# Patient Record
Sex: Female | Born: 1955
Health system: Southern US, Community
[De-identification: ages and names within clinical notes are randomized; demographics above are authoritative.]

## PROBLEM LIST (undated history)

## (undated) DIAGNOSIS — I10 Essential (primary) hypertension: Secondary | ICD-10-CM

## (undated) DIAGNOSIS — N970 Female infertility associated with anovulation: Secondary | ICD-10-CM

## (undated) DIAGNOSIS — E039 Hypothyroidism, unspecified: Secondary | ICD-10-CM

## (undated) DIAGNOSIS — N915 Oligomenorrhea, unspecified: Secondary | ICD-10-CM

## (undated) DIAGNOSIS — E669 Obesity, unspecified: Secondary | ICD-10-CM

## (undated) DIAGNOSIS — E282 Polycystic ovarian syndrome: Secondary | ICD-10-CM

## (undated) DIAGNOSIS — A429 Actinomycosis, unspecified: Secondary | ICD-10-CM

## (undated) DIAGNOSIS — Z8639 Personal history of other endocrine, nutritional and metabolic disease: Secondary | ICD-10-CM

## (undated) DIAGNOSIS — Z30430 Encounter for insertion of intrauterine contraceptive device: Secondary | ICD-10-CM

## (undated) DIAGNOSIS — R946 Abnormal results of thyroid function studies: Secondary | ICD-10-CM

## (undated) DIAGNOSIS — E119 Type 2 diabetes mellitus without complications: Secondary | ICD-10-CM

## (undated) DIAGNOSIS — Z8742 Personal history of other diseases of the female genital tract: Secondary | ICD-10-CM

## (undated) HISTORY — DX: Abnormal results of thyroid function studies: R94.6

## (undated) HISTORY — DX: Type 2 diabetes mellitus without complications: E11.9

## (undated) HISTORY — DX: Personal history of other diseases of the female genital tract: Z87.42

## (undated) HISTORY — DX: Essential (primary) hypertension: I10

## (undated) HISTORY — DX: Polycystic ovarian syndrome: E28.2

## (undated) HISTORY — DX: Obesity, unspecified: E66.9

## (undated) HISTORY — PX: THYROID EXPLORATION: SHX5280

## (undated) HISTORY — DX: Actinomycosis, unspecified: A42.9

## (undated) HISTORY — DX: Encounter for insertion of intrauterine contraceptive device: Z30.430

## (undated) HISTORY — DX: Oligomenorrhea, unspecified: N91.5

## (undated) HISTORY — DX: Female infertility associated with anovulation: N97.0

## (undated) HISTORY — DX: Personal history of other endocrine, nutritional and metabolic disease: Z86.39

---
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
## 1990-06-02 DIAGNOSIS — Z30430 Encounter for insertion of intrauterine contraceptive device: Secondary | ICD-10-CM

## 1990-06-02 HISTORY — DX: Encounter for insertion of intrauterine contraceptive device: Z30.430

## 1998-02-04 ENCOUNTER — Ambulatory Visit (HOSPITAL_COMMUNITY): Admission: RE | Admit: 1998-02-04 | Discharge: 1998-02-04 | Payer: Self-pay | Admitting: Obstetrics and Gynecology

## 1998-03-03 ENCOUNTER — Encounter: Admission: RE | Admit: 1998-03-03 | Discharge: 1998-06-01 | Payer: Self-pay | Admitting: Family Medicine

## 1999-01-05 ENCOUNTER — Other Ambulatory Visit: Admission: RE | Admit: 1999-01-05 | Discharge: 1999-01-05 | Payer: Self-pay | Admitting: Obstetrics and Gynecology

## 2000-03-27 ENCOUNTER — Other Ambulatory Visit: Admission: RE | Admit: 2000-03-27 | Discharge: 2000-03-27 | Payer: Self-pay | Admitting: Obstetrics and Gynecology

## 2001-04-21 ENCOUNTER — Encounter: Admission: RE | Admit: 2001-04-21 | Discharge: 2001-07-20 | Payer: Self-pay | Admitting: Family Medicine

## 2001-04-23 ENCOUNTER — Other Ambulatory Visit: Admission: RE | Admit: 2001-04-23 | Discharge: 2001-04-23 | Payer: Self-pay | Admitting: Obstetrics and Gynecology

## 2003-06-09 ENCOUNTER — Other Ambulatory Visit: Admission: RE | Admit: 2003-06-09 | Discharge: 2003-06-09 | Payer: Self-pay | Admitting: Obstetrics and Gynecology

## 2003-07-08 ENCOUNTER — Ambulatory Visit (HOSPITAL_COMMUNITY): Admission: RE | Admit: 2003-07-08 | Discharge: 2003-07-08 | Payer: Self-pay | Admitting: Cardiology

## 2004-04-05 ENCOUNTER — Other Ambulatory Visit: Admission: RE | Admit: 2004-04-05 | Discharge: 2004-04-05 | Payer: Self-pay | Admitting: Obstetrics and Gynecology

## 2005-04-09 ENCOUNTER — Other Ambulatory Visit: Admission: RE | Admit: 2005-04-09 | Discharge: 2005-04-09 | Payer: Self-pay | Admitting: Obstetrics and Gynecology

## 2006-04-23 ENCOUNTER — Other Ambulatory Visit: Admission: RE | Admit: 2006-04-23 | Discharge: 2006-04-23 | Payer: Self-pay | Admitting: Obstetrics and Gynecology

## 2011-03-27 ENCOUNTER — Encounter: Payer: Self-pay | Admitting: Gastroenterology

## 2012-07-11 DIAGNOSIS — Z8639 Personal history of other endocrine, nutritional and metabolic disease: Secondary | ICD-10-CM | POA: Insufficient documentation

## 2012-07-11 DIAGNOSIS — E282 Polycystic ovarian syndrome: Secondary | ICD-10-CM | POA: Insufficient documentation

## 2012-07-11 DIAGNOSIS — E669 Obesity, unspecified: Secondary | ICD-10-CM | POA: Insufficient documentation

## 2012-07-11 DIAGNOSIS — N970 Female infertility associated with anovulation: Secondary | ICD-10-CM | POA: Insufficient documentation

## 2012-07-11 DIAGNOSIS — E119 Type 2 diabetes mellitus without complications: Secondary | ICD-10-CM | POA: Insufficient documentation

## 2012-07-11 DIAGNOSIS — Z8742 Personal history of other diseases of the female genital tract: Secondary | ICD-10-CM | POA: Insufficient documentation

## 2012-07-11 DIAGNOSIS — Z30432 Encounter for removal of intrauterine contraceptive device: Secondary | ICD-10-CM | POA: Insufficient documentation

## 2012-07-11 DIAGNOSIS — R946 Abnormal results of thyroid function studies: Secondary | ICD-10-CM | POA: Insufficient documentation

## 2012-07-11 DIAGNOSIS — N915 Oligomenorrhea, unspecified: Secondary | ICD-10-CM | POA: Insufficient documentation

## 2012-07-11 DIAGNOSIS — I10 Essential (primary) hypertension: Secondary | ICD-10-CM | POA: Insufficient documentation

## 2012-07-11 DIAGNOSIS — Z30430 Encounter for insertion of intrauterine contraceptive device: Secondary | ICD-10-CM | POA: Insufficient documentation

## 2012-07-11 DIAGNOSIS — A429 Actinomycosis, unspecified: Secondary | ICD-10-CM | POA: Insufficient documentation

## 2012-07-16 ENCOUNTER — Ambulatory Visit: Payer: Self-pay | Admitting: Obstetrics and Gynecology

## 2012-07-29 ENCOUNTER — Ambulatory Visit (INDEPENDENT_AMBULATORY_CARE_PROVIDER_SITE_OTHER): Payer: BC Managed Care – PPO | Admitting: Obstetrics and Gynecology

## 2012-07-29 ENCOUNTER — Encounter: Payer: Self-pay | Admitting: Obstetrics and Gynecology

## 2012-07-29 VITALS — BP 140/84 | HR 82 | Ht 64.5 in | Wt 298.0 lb

## 2012-07-29 DIAGNOSIS — Z124 Encounter for screening for malignant neoplasm of cervix: Secondary | ICD-10-CM

## 2012-07-29 DIAGNOSIS — N951 Menopausal and female climacteric states: Secondary | ICD-10-CM

## 2012-07-29 DIAGNOSIS — Z01419 Encounter for gynecological examination (general) (routine) without abnormal findings: Secondary | ICD-10-CM

## 2012-07-29 DIAGNOSIS — R6882 Decreased libido: Secondary | ICD-10-CM

## 2012-07-29 NOTE — Progress Notes (Signed)
Regular Periods: no Mammogram: yes  Monthly Breast Ex.: yes Exercise: yes  Tetanus < 10 years: no Seatbelts: yes  NI. Bladder Functn.: yes Abuse at home: no  Daily BM's: yes Stressful Work: yes  Healthy Diet: yes Sigmoid-Colonoscopy: NEVER  Calcium: no Medical problems this year: NO PROBLEMS   LAST PAP:2012  Contraception: IUD MIRENA  Mammogram:  12/12  PCP: DR. Chalmers Cater, DR.SMITH  PMH: NO CHANGE  Marine: NO CHANGE  Last Bone Scan: NO  PT IS MARRIED. +

## 2012-07-29 NOTE — Progress Notes (Signed)
Subjective:    Julie [name] is a 56 y.o. female G3P2 who presents for annual exam. The patient has no complaints except has had decreased libido for the past 2 years.  Admits to fatigue (physical & mental) and over the past year has not had her usual molimina.  Reviewed  causes of decreased libido and the developmental changes that come with menopause.    Review of Systems Gastrointestinal:No change in bowel habits, no abdominal pain, no rectal bleeding Genitourinary:negative for abnormal vaginal bleeding,  dysuria, frequency, hematuria, nocturia and urinary incontinence    Objective:     Ht 5' 4.5" (1.638 m)  Wt 298 lb (135.172 kg)  BMI 50.36 kg/m2 Weight:  Wt Readings from Last 1 Encounters:  07/29/12 298 lb (135.172 kg)   Body mass index is 50.36 kg/(m^2). General Appearance:  Well nourished in no acute distress HEENT: Grossly normal Neck / Thyroid: Supple, no masses, nodes or enlargement Lungs: Clear to auscultation bilaterally Back: No CVA tenderness Breast Exam: No masses or nodes.No dimpling, nipple retraction or discharge. Cardiovascular: Regular rate and rhythm.  Gastrointestinal: Soft, non-tender, no masses or organomegaly Pelvic Exam: EGBUS-normal, vagina-pink mucosa without lesions, cervix-no tenderness or lesions, uterus-appears normal size, shape and consistency, adnexae-no masses or tenderness Rectovaginal: deferred duet to patient's request Lymphatic Exam: Non-palpable nodes in neck, clavicular, axillary, or inguinal regions Skin: No rash or abnormalities Extremities: no clubbing cyanosis or edema Neurologic: Grossly normal Psychiatric: Alert and oriented x 3    Assessment:   Routine GYN Exam   Plan:   PAP sent  RTO 1 year or prn   Shona Pardo,ELMIRAPA-C

## 2012-07-30 ENCOUNTER — Telehealth: Payer: Self-pay | Admitting: Obstetrics and Gynecology

## 2012-07-30 LAB — PAP IG W/ RFLX HPV ASCU

## 2012-07-30 LAB — TESTOSTERONE, FREE, TOTAL, SHBG
Sex Hormone Binding: 15 nmol/L — ABNORMAL LOW (ref 18–114)
Testosterone: 20.93 ng/dL (ref 10–70)

## 2012-07-30 LAB — FOLLICLE STIMULATING HORMONE: FSH: 27.6 m[IU]/mL

## 2012-07-30 NOTE — Telephone Encounter (Signed)
Call to patient to advise that she is not menopausal and that her other labs were normal.  Patient was not available and per female respondent, she was at work.  Attempted to reach patient at the work number listed however, number was not in service.  Ercel Normoyle, PA-C

## 2012-08-01 LAB — HUMAN PAPILLOMAVIRUS, HIGH RISK: HPV DNA High Risk: NOT DETECTED

## 2012-08-05 ENCOUNTER — Encounter: Payer: Self-pay | Admitting: Obstetrics and Gynecology

## 2012-08-07 ENCOUNTER — Telehealth: Payer: Self-pay | Admitting: Obstetrics and Gynecology

## 2012-08-07 NOTE — Telephone Encounter (Signed)
vph pt

## 2012-08-07 NOTE — Telephone Encounter (Signed)
Lm on vm to cb per telephone call rgdg letter received about pap smear.

## 2013-09-15 ENCOUNTER — Ambulatory Visit: Payer: Self-pay

## 2013-09-22 ENCOUNTER — Ambulatory Visit: Payer: Self-pay

## 2013-09-29 ENCOUNTER — Ambulatory Visit: Payer: Self-pay

## 2013-10-07 ENCOUNTER — Telehealth: Payer: Self-pay | Admitting: *Deleted

## 2013-10-07 NOTE — Telephone Encounter (Signed)
I informed pt her fungal culture was positive for fungus.  I explained Dr. Shaune Pollack orders of Onmel #90 one tablet everyday, hepatic function labs to be performed prior to beginning the medication.  Pt states could have the labs drawn in 10/2013 at her endocrinologist, but would like to look for more information on Onmel, and would like to know if laser would help the one toenail affected.  Dr. Valentina Lucks states the Laser would work, I informed the pt.  Pt states she will discuss with her husband and call again.

## 2014-08-30 ENCOUNTER — Encounter: Payer: Self-pay | Admitting: Obstetrics and Gynecology

## 2015-07-01 DIAGNOSIS — I701 Atherosclerosis of renal artery: Secondary | ICD-10-CM | POA: Diagnosis present

## 2015-07-03 ENCOUNTER — Other Ambulatory Visit: Payer: Self-pay | Admitting: Cardiology

## 2015-07-03 NOTE — H&P (Signed)
OFFICE VISIT NOTES COPIED TO EPIC FOR DOCUMENTATION  Julie [name] 04-Jul-2015 8:21 AM Location: [facility] Patient #: [id number] DOB: 1956-02-23 Married / Language: English / Race: Black or African American Female  History of Present Illness Julie [name] AGNP-C; 07/04/2015 4:02 PM) The patient is a 59 year old female who presents for a Follow-up for Hypertension. Symptoms include shortness of breath, while symptoms do not include headache, dizziness, chest pain or cough while on ACE-I/ARB. Recent blood pressure has been mostly > 140/80. The patient describes this as mild and worsening. Current treatment includes thiazide diuretic, angiotensin receptor blockers, alpha blockers, beta blockers and calcium channel blockers. By report there is good compliance with treatment, good tolerance of treatment and fair symptom control. Pertinent medical history includes diabetes and dyslipidemia. Risk factors include obesity, physical inactivity, diabetes and hyperlipidemia. Pertinent family history includes cardiovascular disease. The patient was previously evaluated by a primary care provider.  Additional reasons for visit:  Follow up test results is described as the following: The patient had a cardiovascular nuclear scan, echocardiography and carotid ultrasound exam. There are no symptoms currently. Current medication use: compliant with dosing regimen, no side effects and considered effective by patient.  Problem List/Past Medical Julie [name]; 07-04-15 11:13 AM) Benign essential hypertension (I10) Labs 04/12/2015: Serum glucose 162, BUN 19, creatinine 1.1, potassium 4.0 Hypercholesteremia (E78.0) Labs 04/12/2015: total cholesterol 281, triglycerides 142, HDL 38, LDL 215, TSH 1.810 Uncontrolled type 2 diabetes mellitus without complication, with long-term current use of insulin (E11.65) Labs 04/12/2015: HbA1c 7.7% Shortness of breath on exertion (R06.02) EKG 05/05/2015: Sinus rhythm  at a rate of 86 bpm, normal axis, left atrial abnormality, 2 mm ST depression in inferior leads and 1 mm ST depression in lateral leads, cannot exclude inferolateral ischemia. Morbid obesity with BMI of 45.0-49.9, adult (E66.01) Family history of premature CAD (Z82.49) Both parents died at age 31 from CAD Abnormal EKG (R94.31) Bilateral carotid bruits (R09.89) Sciatica (M54.30) Hypothyroidism, adult (E03.9)  Allergies (Julie [name]; 07-04-15 11:13 AM) Toprol XL *BETA BLOCKERS* Glimepiride *ANTIDIABETICS* Sulfa Drugs  Family History (Julie [name]; 07/04/2015 11:13 AM) Mother Deceased. at age 70 from MI; no strokes or MIs prior to this event; HTN but no other cardiovascular conditions Father Deceased. at age 84 from MI; no strokes or MIs prior to this event; no other cardiovascular conditions Sister 1 In good health. 5 yrs older; no cardiovascular conditions  Social History Julie [name]; July 04, 2015 11:13 AM) Current tobacco use Never smoker. Non Drinker/No Alcohol Use Marital status Married. Living Situation Lives with spouse. Number of Children 2.  Past Surgical History Julie [name]; 2015-07-04 11:13 AM) None07/04/2015  Medication History Julie [name]; 07/04/2015 11:15 AM) Aspirin Adult Low Dose (81MG Tablet DR, 1 (one) Tablet DR Oral daily, Taken starting 05/05/2015) Active. Spironolactone (50MG Tablet, 1 (one) Tablet Oral daily, Taken starting 05/05/2015) Active. Atenolol (50MG Tablet, 1 Oral daily) Active. Rosuvastatin Calcium (5MG Tablet, 1 Oral daily) Active. Kombiglyze XR (2.5-1000MG Tablet ER 24HR, 1 Oral two times daily) Active. CloNIDine HCl (0.2MG Tablet, 1 Oral daily) Active. Vitamin D (Ergocalciferol) (50000UNIT Capsule, 1 Oral weekly) Active. Tribenzor (40-10-25MG Tablet, 1 Oral at bedtime) Active. Levothyroxine Sodium (137MCG Tablet, 1 Oral daily) Active. NovoLOG Mix 70/30 FlexPen ((70-30) 100UNIT/ML Susp Pen-inj,  inject 20 units in morning and Subcutaneous 15 units in the evening) Active. Zioptan (0.0015% Solution, 1 drop each eye Ophthalmic daily) Active. Magnesium Gluconate (250MG Tablet, Oral) Active. Magnesium (250MG Tablet, 1 Oral daily) Active. Medications Reconciled  Diagnostic Studies History Julie Brook  [name]; 06/10/2015 8:22 AM) Echocardiogram08/11/2014 1. Left ventricle cavity is normal in size. Moderate concentric hypertrophy of the left ventricle. Normal global wall motion. Calculated EF 68%. 2. Mild mitral regurgitation. 3. Mild tricuspid regurgitation. No evidence of pulmonary hypertension. Nuclear stress test07/22/2016 1. The resting electrocardiogram demonstrated normal sinus rhythm, incomplete RBBB and no resting arrhythmias. Stress EKG is non-diagnostic for ischemia as it a pharmacologic stress using Lexiscan. 2. Myocardial perfusion imaging is normal. Overall left ventricular systolic function was normal without regional wall motion abnormalities. The left ventricular ejection fraction was 64%. Carotid Doppler08/12/2014 Moderate stenosis in the right internal carotid artery (50-69%). Mild stenosis in the right common carotid artery (<50%). Moderate stenosis in the left external carotid artery (>50%). Severe stenosis in the left internal carotid artery (>70%). Severe stenosis in the left common carotid artery (>50%) probably in the range > 80-99%. Heavy soft plaque burden throughout the bilateral carotid arteries. Consider further work-up. Follow-up in 6 months if clinically indicated.   Review of Systems [facility] Julie [name], [place]; 06/10/2015 4:12 PM) General Not Present- Anorexia, Fatigue and Fever. Respiratory Present- Decreased Exercise Tolerance and Difficulty Breathing on Exertion. Not Present- Cough. Cardiovascular Present- Edema and Elevated Blood Pressure. Not Present- Chest Pain, Claudications, Orthopnea, Paroxysmal Nocturnal Dyspnea and Shortness of Breath. Gastrointestinal  Not Present- Change in Bowel Habits, Constipation and Nausea. Neurological Not Present- Focal Neurological Symptoms. Endocrine Not Present- Appetite Changes, Cold Intolerance and Heat Intolerance. Hematology Not Present- Anemia, Petechiae and Prolonged Bleeding. Vitals Julie [name]; 06/10/2015 11:19 AM) 06/10/2015 11:18 AM Weight: 291 lb Height: 64.5in Body Surface Area: 2.31 m Body Mass Index: 49.18 kg/m  Pulse: 82 (Regular)  P.OX: 98% (Room air) BP: 168/88 (Sitting, Left Arm, Standard)     Physical Exam (Bridgette Julie [name], AGNP-C; 06/10/2015 4:12 PM) General Mental Status-Alert. General Appearance-Cooperative, Appears stated age, Not in acute distress. Orientation-Oriented X3. Build & Nutrition-Moderately built and Morbidly obese.  Head and Neck Thyroid Gland Characteristics - no palpable nodules, no palpable enlargement.  Chest and Lung Exam Palpation Tender - No chest wall tenderness. Auscultation Breath sounds - Clear.  Cardiovascular Inspection Jugular vein - Right - No Distention. Auscultation Heart Sounds - S1 WNL, S2 WNL and No gallop present. Murmurs & Other Heart Sounds - Murmur - No murmur.  Abdomen Inspection Contour - Obese and Pannus present. Palpation/Percussion Normal exam - Non Tender and No hepatosplenomegaly. Auscultation Normal exam - Bowel sounds normal.  Peripheral Vascular Lower Extremity Inspection - Left - No Pigmentation, No Varicose veins. Right - No Pigmentation, No Varicose veins. Palpation - Edema - Left - Trace edema. Right - Trace edema. Femoral pulse - Left - Normal. Right - Normal. Popliteal pulse - Left - Normal. Right - Normal. Dorsalis pedis pulse - Left - Normal. Right - Normal. Posterior tibial pulse - Left - Normal. Right - Normal. Carotid arteries - Bilateral-Soft bilateral carotid bruit.Pecola Leisure prominent abdominal aortic pulsation, No epigastric bruit.  Neurologic Motor-Grossly intact  without any focal deficits.  Musculoskeletal Global Assessment Left Lower Extremity - normal range of motion without pain. Right Lower Extremity - normal range of motion without pain.  Assessment & Plan (Bridgette Julie [name] AGNP-C; 06/10/2015 4:11 PM) Shortness of breath on exertion (R06.02) Story: Echo- 05/31/2015 1. Left ventricle cavity is normal in size. Moderate concentric hypertrophy of the left ventricle. Normal global wall motion. Calculated EF 68%. 2. Mild mitral regurgitation. 3. Mild tricuspid regurgitation. No evidence of pulmonary hypertension.  Lexiscan myoview stress test 05/20/2015: 1. The resting electrocardiogram demonstrated normal sinus rhythm,  incomplete RBBB and no resting arrhythmias. Stress EKG is non-diagnostic for ischemia as it a pharmacologic stress using Lexiscan. 2. Myocardial perfusion imaging is normal. Overall left ventricular systolic function was normal without regional wall motion abnormalities. The left ventricular ejection fraction was 64%. Impression: EKG 05/05/2015: Sinus rhythm at a rate of 86 bpm, normal axis, left atrial abnormality, 2 mm ST depression in inferior leads and 1 mm ST depression in lateral leads, cannot exclude inferolateral ischemia. Abnormal EKG (R94.31) Accelerated hypertension (I10) Story: Labs 04/12/2015: Serum glucose 162, BUN 19, creatinine 1.1, potassium 4.0 Future Plans 6/72/0947: METABOLIC PANEL, BASIC (09628) - one time 06/27/2015: CBC & PLATELETS (AUTO) (36629) - one time Hypercholesteremia (E78.0) Story: Labs 04/12/2015: total cholesterol 281, triglycerides 142, HDL 38, LDL 215, TSH 1.810 Current Plans Discontinued Crestor 5MG (increase to 95m). Started Crestor 40MG, 1 (one) Tablet daily, #30, 30 days starting 06/10/2015, Ref. x6. Uncontrolled type 2 diabetes mellitus without complication, with long-term current use of insulin (E11.65) Story: Labs 04/12/2015: HbA1c 7.7% Asymptomatic bilateral carotid artery stenosis  ((U76.54 Story: Carotid artery duplex 06/01/2015: Moderate stenosis in the right internal carotid artery (50-69%). Mild stenosis in the right common carotid artery (<50%). Moderate stenosis in the left external carotid artery (>50%). Severe stenosis in the left internal carotid artery (>70%). Severe stenosis in the left common carotid artery (>50%) probably in the range > 80-99%. Heavy soft plaque burden throughout the bilateral carotid arteries. Consider further work-up. Follow-up in 6 months if clinically indicated. Morbid obesity with BMI of 45.0-49.9, adult (E66.01) Family history of premature CAD ((Z77.49 Story: Both parents died at age 889from CAD Current Plans Mechanism of underlying disease process and action of medications discussed with the patient. I discussed primary/secondary prevention and also dietary counseling was done. She presents for follow-up of echocardiogram, stress test, and carotid duplex. Echo reveals moderate concentric LVH, but is otherwise normal. Stress test was normal without evidence of ischemia. Carotid duplex reveals significant bilateral carotid artery stenosis. Due to morbid obesity, difficult to adequately evaluate carotid anatomy. Discussed CT angiography versus percutaneous angiography. Pt will also need renal angiography due to accelerated hypertension on 7 anti-hypertensives. After extensive discussion, patient has opted for percutaneous angiography of both carotids and renal arteries. Will increase Crestor to 480mand allow her to be on this dose for at least 2 weeks prior to performing angiography. Patient understands the risks, benefits, alternatives including medical therapy, CT angiography. Patient understands <1-2% risk of death, embolic complications, bleeding, infection, renal failure, urgent surgical revascularization, but not limited to these and wants to proceed. Blood pressure markedly elevated, however, will not increase medications until arterial  anatomy can be adequately evaluated to avoid cerebral hyperperfusion. Follow up after angiography.  *I have discussed this case with Dr. GaEinar Gipnd he personally examined the patient and participated in formulating the plan.*  Future Plans 06/27/2015: PT (PROTHROMBIN TIME) ([id number]- one time Addendum Note(Bridgette Allison AGNP-C; 06/30/2015 10:10 AM) Labs 06/29/2015: BUN 50, creatinine 2.48, eGFR 24, potassium 4.8, H&H normal with microcytic indices, CBC otherwise PTT 10.5, INR 1.0     Signed by BrNeldon LabellaAGNP-C (06/10/2015 4:13 PM)  I have personally reviewed the patient's record and performed physical exam and agree with the assessment and plan of Ms. BrNeldon LabellaNP-C.  GAAdrian ProwsMD 07/03/2015, 5:27 PM [facility]. PAHaskellager: [phone] Office: [phone] no answer: Cell:  [phone]

## 2015-07-04 ENCOUNTER — Emergency Department (HOSPITAL_COMMUNITY): Admission: EM | Admit: 2015-07-04 | Discharge: 2015-07-04 | Payer: BC Managed Care – PPO

## 2015-07-04 ENCOUNTER — Encounter (HOSPITAL_COMMUNITY): Payer: Self-pay | Admitting: *Deleted

## 2015-07-04 ENCOUNTER — Observation Stay (HOSPITAL_COMMUNITY)
Admission: RE | Admit: 2015-07-04 | Discharge: 2015-07-06 | Disposition: A | Discharge status: Home / Self Care | Payer: BC Managed Care – PPO | Source: Ambulatory Visit | Attending: Cardiology | Admitting: Cardiology

## 2015-07-04 DIAGNOSIS — I701 Atherosclerosis of renal artery: Principal | ICD-10-CM | POA: Insufficient documentation

## 2015-07-04 DIAGNOSIS — E780 Pure hypercholesterolemia: Secondary | ICD-10-CM | POA: Insufficient documentation

## 2015-07-04 DIAGNOSIS — Z8249 Family history of ischemic heart disease and other diseases of the circulatory system: Secondary | ICD-10-CM | POA: Insufficient documentation

## 2015-07-04 DIAGNOSIS — N183 Chronic kidney disease, stage 3 unspecified: Secondary | ICD-10-CM | POA: Diagnosis present

## 2015-07-04 DIAGNOSIS — Z7982 Long term (current) use of aspirin: Secondary | ICD-10-CM | POA: Insufficient documentation

## 2015-07-04 DIAGNOSIS — Z6841 Body Mass Index (BMI) 40.0 and over, adult: Secondary | ICD-10-CM | POA: Insufficient documentation

## 2015-07-04 DIAGNOSIS — I129 Hypertensive chronic kidney disease with stage 1 through stage 4 chronic kidney disease, or unspecified chronic kidney disease: Secondary | ICD-10-CM | POA: Diagnosis not present

## 2015-07-04 DIAGNOSIS — E785 Hyperlipidemia, unspecified: Secondary | ICD-10-CM | POA: Insufficient documentation

## 2015-07-04 DIAGNOSIS — I150 Renovascular hypertension: Secondary | ICD-10-CM | POA: Insufficient documentation

## 2015-07-04 DIAGNOSIS — E1122 Type 2 diabetes mellitus with diabetic chronic kidney disease: Secondary | ICD-10-CM | POA: Diagnosis not present

## 2015-07-04 DIAGNOSIS — E1165 Type 2 diabetes mellitus with hyperglycemia: Secondary | ICD-10-CM | POA: Diagnosis not present

## 2015-07-04 LAB — CBC
HCT: 41.8 % (ref 36.0–46.0)
Hemoglobin: 13.3 g/dL (ref 12.0–15.0)
MCH: 24.4 pg — AB (ref 26.0–34.0)
MCHC: 31.8 g/dL (ref 30.0–36.0)
MCV: 76.7 fL — ABNORMAL LOW (ref 78.0–100.0)
PLATELETS: 188 10*3/uL (ref 150–400)
RBC: 5.45 MIL/uL — AB (ref 3.87–5.11)
RDW: 15.5 % (ref 11.5–15.5)
WBC: 8.1 10*3/uL (ref 4.0–10.5)

## 2015-07-04 LAB — BASIC METABOLIC PANEL
ANION GAP: 9 (ref 5–15)
BUN: 44 mg/dL — AB (ref 6–20)
CHLORIDE: 102 mmol/L (ref 101–111)
CO2: 26 mmol/L (ref 22–32)
Calcium: 10.2 mg/dL (ref 8.9–10.3)
Creatinine, Ser: 3.25 mg/dL — ABNORMAL HIGH (ref 0.44–1.00)
GFR calc Af Amer: 17 mL/min — ABNORMAL LOW (ref >60)
GFR, EST NON AFRICAN AMERICAN: 15 mL/min — AB (ref >60)
GLUCOSE: 126 mg/dL — AB (ref 65–99)
POTASSIUM: 4.1 mmol/L (ref 3.5–5.1)
Sodium: 137 mmol/L (ref 135–145)

## 2015-07-04 LAB — PROTIME-INR
INR: 1.13 (ref 0.00–1.49)
PROTHROMBIN TIME: 14.7 s (ref 11.6–15.2)

## 2015-07-04 LAB — HCG, SERUM, QUALITATIVE: PREG SERUM: NEGATIVE

## 2015-07-04 LAB — GLUCOSE, CAPILLARY
GLUCOSE-CAPILLARY: 164 mg/dL — AB (ref 65–99)
GLUCOSE-CAPILLARY: 115 mg/dL — AB (ref 65–99)

## 2015-07-04 MED ORDER — SODIUM CHLORIDE 0.9 % WEIGHT BASED INFUSION
1.0000 mL/kg/h | INTRAVENOUS | Status: DC
  Administered 2015-07-04: 1 mL/kg/h via INTRAVENOUS
  Administered 2015-07-05: 3.925 mL/kg/h via INTRAVENOUS

## 2015-07-04 MED ORDER — SODIUM BICARBONATE BOLUS VIA INFUSION: INTRAVENOUS | Status: DC

## 2015-07-04 MED ORDER — SODIUM BICARBONATE 8.4 % IV SOLN: INTRAVENOUS | Status: DC

## 2015-07-04 MED ORDER — SODIUM BICARBONATE BOLUS VIA INFUSION
INTRAVENOUS | Status: AC
  Administered 2015-07-05: 08:00:00 via INTRAVENOUS
  Filled 2015-07-04: qty 1

## 2015-07-04 MED ORDER — ACETAMINOPHEN 650 MG RE SUPP: 650.0000 mg | Freq: Four times a day (QID) | RECTAL | Status: DC | PRN

## 2015-07-04 MED ORDER — ASPIRIN 81 MG PO CHEW
81.0000 mg | CHEWABLE_TABLET | ORAL | Status: AC
  Administered 2015-07-05: 81 mg via ORAL
  Filled 2015-07-04: qty 1

## 2015-07-04 MED ORDER — INFLUENZA VAC SPLIT QUAD 0.5 ML IM SUSY
0.5000 mL | PREFILLED_SYRINGE | INTRAMUSCULAR | Status: DC
  Filled 2015-07-04: qty 0.5

## 2015-07-04 MED ORDER — SODIUM BICARBONATE 8.4 % IV SOLN
INTRAVENOUS | Status: DC
  Filled 2015-07-04: qty 500

## 2015-07-04 MED ORDER — SODIUM CHLORIDE 0.9 % IV SOLN
250.0000 mL | INTRAVENOUS | Status: DC | PRN
Start: 2015-07-04 — End: 2015-07-05

## 2015-07-04 MED ORDER — SODIUM CHLORIDE 0.9 % IJ SOLN: 3.0000 mL | INTRAMUSCULAR | Status: DC | PRN

## 2015-07-04 MED ORDER — ACETAMINOPHEN 325 MG PO TABS: 650.0000 mg | ORAL_TABLET | Freq: Four times a day (QID) | ORAL | Status: DC | PRN

## 2015-07-04 MED ORDER — SODIUM CHLORIDE 0.9 % IJ SOLN: 3.0000 mL | Freq: Two times a day (BID) | INTRAMUSCULAR | Status: DC

## 2015-07-04 MED ORDER — SODIUM BICARBONATE 8.4 % IV SOLN
INTRAVENOUS | Status: DC
  Filled 2015-07-04: qty 1000

## 2015-07-05 ENCOUNTER — Encounter (HOSPITAL_COMMUNITY): Payer: Self-pay | Admitting: Cardiology

## 2015-07-05 ENCOUNTER — Encounter (HOSPITAL_COMMUNITY)
Admission: RE | Disposition: A | Discharge status: Home / Self Care | Payer: BC Managed Care – PPO | Source: Ambulatory Visit | Attending: Cardiology

## 2015-07-05 DIAGNOSIS — I701 Atherosclerosis of renal artery: Secondary | ICD-10-CM | POA: Diagnosis not present

## 2015-07-05 DIAGNOSIS — N183 Chronic kidney disease, stage 3 (moderate): Secondary | ICD-10-CM | POA: Diagnosis not present

## 2015-07-05 DIAGNOSIS — I150 Renovascular hypertension: Secondary | ICD-10-CM | POA: Diagnosis not present

## 2015-07-05 HISTORY — PX: PERIPHERAL VASCULAR CATHETERIZATION: SHX172C

## 2015-07-05 LAB — BASIC METABOLIC PANEL
ANION GAP: 10 (ref 5–15)
BUN: 37 mg/dL — ABNORMAL HIGH (ref 6–20)
CALCIUM: 9.9 mg/dL (ref 8.9–10.3)
CO2: 24 mmol/L (ref 22–32)
Chloride: 106 mmol/L (ref 101–111)
Creatinine, Ser: 2.73 mg/dL — ABNORMAL HIGH (ref 0.44–1.00)
GFR, EST AFRICAN AMERICAN: 21 mL/min — AB (ref >60)
GFR, EST NON AFRICAN AMERICAN: 18 mL/min — AB (ref >60)
Glucose, Bld: 123 mg/dL — ABNORMAL HIGH (ref 65–99)
POTASSIUM: 4.1 mmol/L (ref 3.5–5.1)
Sodium: 140 mmol/L (ref 135–145)

## 2015-07-05 LAB — CBC
HCT: 39 % (ref 36.0–46.0)
HEMATOCRIT: 41.1 % (ref 36.0–46.0)
HEMOGLOBIN: 13.5 g/dL (ref 12.0–15.0)
HEMOGLOBIN: 12.7 g/dL (ref 12.0–15.0)
MCH: 25.1 pg — ABNORMAL LOW (ref 26.0–34.0)
MCH: 24.9 pg — AB (ref 26.0–34.0)
MCHC: 32.8 g/dL (ref 30.0–36.0)
MCHC: 32.6 g/dL (ref 30.0–36.0)
MCV: 76.5 fL — AB (ref 78.0–100.0)
MCV: 76.3 fL — AB (ref 78.0–100.0)
PLATELETS: 174 10*3/uL (ref 150–400)
Platelets: 138 10*3/uL — ABNORMAL LOW (ref 150–400)
RBC: 5.37 MIL/uL — ABNORMAL HIGH (ref 3.87–5.11)
RBC: 5.11 MIL/uL (ref 3.87–5.11)
RDW: 15.5 % (ref 11.5–15.5)
RDW: 15.4 % (ref 11.5–15.5)
WBC: 10.7 10*3/uL — ABNORMAL HIGH (ref 4.0–10.5)
WBC: 9.2 10*3/uL (ref 4.0–10.5)

## 2015-07-05 LAB — POCT ACTIVATED CLOTTING TIME: ACTIVATED CLOTTING TIME: 251 s

## 2015-07-05 LAB — GLUCOSE, CAPILLARY
GLUCOSE-CAPILLARY: 258 mg/dL — AB (ref 65–99)
GLUCOSE-CAPILLARY: 148 mg/dL — AB (ref 65–99)
Glucose-Capillary: 137 mg/dL — ABNORMAL HIGH (ref 65–99)
Glucose-Capillary: 396 mg/dL — ABNORMAL HIGH (ref 65–99)
Glucose-Capillary: 410 mg/dL — ABNORMAL HIGH (ref 65–99)

## 2015-07-05 SURGERY — RENAL ANGIOGRAPHY
Anesthesia: LOCAL

## 2015-07-05 MED ORDER — INSULIN ASPART 100 UNIT/ML ~~LOC~~ SOLN
0.0000 [IU] | Freq: Three times a day (TID) | SUBCUTANEOUS | Status: DC
  Administered 2015-07-05: 14:00:00 11 [IU] via SUBCUTANEOUS
  Administered 2015-07-05: 20 [IU] via SUBCUTANEOUS
  Administered 2015-07-06: 08:00:00 4 [IU] via SUBCUTANEOUS

## 2015-07-05 MED ORDER — IODIXANOL 320 MG/ML IV SOLN
INTRAVENOUS | Status: DC | PRN
  Administered 2015-07-05: 35 mL via INTRA_ARTERIAL

## 2015-07-05 MED ORDER — ONDANSETRON HCL 4 MG/2ML IJ SOLN: 4.0000 mg | Freq: Four times a day (QID) | INTRAMUSCULAR | Status: DC | PRN

## 2015-07-05 MED ORDER — MIDAZOLAM HCL 2 MG/2ML IJ SOLN
INTRAMUSCULAR | Status: AC
  Filled 2015-07-05: qty 4

## 2015-07-05 MED ORDER — HEPARIN SODIUM (PORCINE) 1000 UNIT/ML IJ SOLN
INTRAMUSCULAR | Status: DC | PRN
  Administered 2015-07-05: 3000 [IU] via INTRAVENOUS
  Administered 2015-07-05: 2000 [IU] via INTRAVENOUS
  Administered 2015-07-05: 8000 [IU] via INTRAVENOUS

## 2015-07-05 MED ORDER — CLOPIDOGREL BISULFATE 75 MG PO TABS
75.0000 mg | ORAL_TABLET | Freq: Every day | ORAL | Status: DC
  Administered 2015-07-06: 75 mg via ORAL
  Filled 2015-07-05: qty 1

## 2015-07-05 MED ORDER — GLIMEPIRIDE 2 MG PO TABS
2.0000 mg | ORAL_TABLET | Freq: Every day | ORAL | Status: DC
  Administered 2015-07-06: 08:00:00 2 mg via ORAL
  Filled 2015-07-05 (×3): qty 1

## 2015-07-05 MED ORDER — CLOPIDOGREL BISULFATE 300 MG PO TABS
ORAL_TABLET | ORAL | Status: DC | PRN
  Administered 2015-07-05: 600 mg via ORAL

## 2015-07-05 MED ORDER — SODIUM CHLORIDE 0.9 % IV SOLN: 250.0000 mL | INTRAVENOUS | Status: DC | PRN

## 2015-07-05 MED ORDER — FENTANYL CITRATE (PF) 100 MCG/2ML IJ SOLN
INTRAMUSCULAR | Status: AC
  Filled 2015-07-05: qty 4

## 2015-07-05 MED ORDER — FENTANYL CITRATE (PF) 100 MCG/2ML IJ SOLN: 50.0000 ug | INTRAMUSCULAR | Status: DC | PRN

## 2015-07-05 MED ORDER — FENTANYL CITRATE (PF) 100 MCG/2ML IJ SOLN: 25.0000 ug | INTRAMUSCULAR | Status: DC | PRN

## 2015-07-05 MED ORDER — LIDOCAINE HCL (PF) 1 % IJ SOLN
INTRAMUSCULAR | Status: DC | PRN
  Administered 2015-07-05: 10 mL

## 2015-07-05 MED ORDER — HEPARIN SODIUM (PORCINE) 1000 UNIT/ML IJ SOLN
INTRAMUSCULAR | Status: AC
  Filled 2015-07-05: qty 1

## 2015-07-05 MED ORDER — ALPRAZOLAM 0.5 MG PO TABS: 1.0000 mg | ORAL_TABLET | Freq: Two times a day (BID) | ORAL | Status: DC | PRN

## 2015-07-05 MED ORDER — ALPRAZOLAM 0.5 MG PO TABS
0.5000 mg | ORAL_TABLET | Freq: Three times a day (TID) | ORAL | Status: DC | PRN
  Administered 2015-07-05: 0.5 mg via ORAL
  Filled 2015-07-05: qty 1

## 2015-07-05 MED ORDER — SODIUM CHLORIDE 0.9 % IV SOLN
INTRAVENOUS | Status: DC | PRN
  Administered 2015-07-05: 250 mL
  Administered 2015-07-05: 20 mL/h via INTRAVENOUS

## 2015-07-05 MED ORDER — SODIUM CHLORIDE 0.9 % IV SOLN
1.0000 mL/kg/h | INTRAVENOUS | Status: AC
  Administered 2015-07-05: 1 mL/kg/h via INTRAVENOUS

## 2015-07-05 MED ORDER — LEVOTHYROXINE SODIUM 137 MCG PO TABS
137.0000 ug | ORAL_TABLET | Freq: Every day | ORAL | Status: DC
  Administered 2015-07-05 – 2015-07-06 (×2): 137 ug via ORAL
  Filled 2015-07-05 (×2): qty 1

## 2015-07-05 MED ORDER — ZOLPIDEM TARTRATE 5 MG PO TABS: 5.0000 mg | ORAL_TABLET | Freq: Every evening | ORAL | Status: DC | PRN

## 2015-07-05 MED ORDER — SODIUM CHLORIDE 0.9 % IJ SOLN
3.0000 mL | Freq: Two times a day (BID) | INTRAMUSCULAR | Status: DC
  Administered 2015-07-05: 14:00:00 3 mL via INTRAVENOUS

## 2015-07-05 MED ORDER — SODIUM CHLORIDE 0.9 % IJ SOLN: 3.0000 mL | INTRAMUSCULAR | Status: DC | PRN

## 2015-07-05 MED ORDER — LIDOCAINE HCL (PF) 1 % IJ SOLN
INTRAMUSCULAR | Status: AC
  Filled 2015-07-05: qty 30

## 2015-07-05 MED ORDER — CLOPIDOGREL BISULFATE 300 MG PO TABS
ORAL_TABLET | ORAL | Status: AC
  Filled 2015-07-05: qty 2

## 2015-07-05 MED ORDER — SIMVASTATIN 20 MG PO TABS
40.0000 mg | ORAL_TABLET | Freq: Every evening | ORAL | Status: DC
  Administered 2015-07-05: 40 mg via ORAL
  Filled 2015-07-05: qty 2

## 2015-07-05 MED ORDER — MIDAZOLAM HCL 2 MG/2ML IJ SOLN
INTRAMUSCULAR | Status: DC | PRN
  Administered 2015-07-05 (×2): 1 mg via INTRAVENOUS
  Administered 2015-07-05: 2 mg via INTRAVENOUS

## 2015-07-05 MED ORDER — HEPARIN (PORCINE) IN NACL 2-0.9 UNIT/ML-% IJ SOLN
INTRAMUSCULAR | Status: AC
  Filled 2015-07-05: qty 1000

## 2015-07-05 MED ORDER — FENTANYL CITRATE (PF) 100 MCG/2ML IJ SOLN
INTRAMUSCULAR | Status: AC
  Administered 2015-07-05: 100 ug
  Filled 2015-07-05: qty 2

## 2015-07-05 MED ORDER — ACETAMINOPHEN 325 MG PO TABS: 650.0000 mg | ORAL_TABLET | ORAL | Status: DC | PRN

## 2015-07-05 MED ORDER — INSULIN ASPART 100 UNIT/ML ~~LOC~~ SOLN
4.0000 [IU] | Freq: Three times a day (TID) | SUBCUTANEOUS | Status: DC
  Administered 2015-07-05 (×2): 4 [IU] via SUBCUTANEOUS

## 2015-07-05 MED ORDER — FENTANYL CITRATE (PF) 100 MCG/2ML IJ SOLN
INTRAMUSCULAR | Status: DC | PRN
  Administered 2015-07-05 (×4): 25 ug via INTRAVENOUS
  Administered 2015-07-05: 50 ug via INTRAVENOUS

## 2015-07-05 SURGICAL SUPPLY — 22 items
BALLOON AVIATOR PLUS 5X15 (BALLOONS) ×1 IMPLANT
CATH CROSS OVER TEMPO 5F (CATHETERS) ×1 IMPLANT
CATH OMNI FLUSH 5F 65CM (CATHETERS) ×1 IMPLANT
DEVICE CLOSURE PERCLS PRGLD 6F (VASCULAR PRODUCTS) IMPLANT
FEM STOP ARCH (HEMOSTASIS) ×1
FILTER CO2 0.2 MICRON (VASCULAR PRODUCTS) ×1 IMPLANT
GUIDE CATH VISTA IMA 6F (CATHETERS) ×1 IMPLANT
KIT ENCORE 26 ADVANTAGE (KITS) ×1 IMPLANT
KIT MICROINTRODUCER STIFF 5F (SHEATH) ×1 IMPLANT
KIT PV (KITS) ×3 IMPLANT
PERCLOSE PROGLIDE 6F (VASCULAR PRODUCTS) ×3
RESERVOIR CO2 (VASCULAR PRODUCTS) ×1 IMPLANT
SET FLUSH CO2 (MISCELLANEOUS) ×1 IMPLANT
SHEATH PINNACLE 5F 10CM (SHEATH) ×1 IMPLANT
SHEATH PINNACLE 6F 10CM (SHEATH) ×1 IMPLANT
SHEATH PINNACLE 7F 10CM (SHEATH) ×1 IMPLANT
STENT PALMAZ BLUE 6X15X80 (Permanent Stent) ×2 IMPLANT
SYSTEM COMPRESSION FEMOSTOP (HEMOSTASIS) IMPLANT
TRANSDUCER W/STOPCOCK (MISCELLANEOUS) ×3 IMPLANT
TRAY PV CATH (CUSTOM PROCEDURE TRAY) ×3 IMPLANT
WIRE HITORQ VERSACORE ST 145CM (WIRE) ×1 IMPLANT
WIRE SPARTACORE .014X190CM (WIRE) ×1 IMPLANT

## 2015-07-05 NOTE — Interval H&P Note (Signed)
History and Physical Interval Note:  07/05/2015 9:46 AM  Julie [name]  has presented today for surgery, with the diagnosis of carotid stenosis, hypertension  The various methods of treatment have been discussed with the patient and family. After consideration of risks, benefits and other options for treatment, the patient has consented to  Procedure(s): Renal Angiography (N/A) as a surgical intervention  And possible angioplasty .  The patient's history has been reviewed, patient examined, no change in status, stable for surgery.  I have reviewed the patient's chart and labs.  Questions were answered to the patient's satisfaction.     Adrian Prows

## 2015-07-05 NOTE — Progress Notes (Signed)
Pharmacy Consult  Pharmacy Consult for sodium bicarbonate Indication: Prevention of contrast-induced nephropathy  Allergies  Allergen Reactions  . Sulfa Antibiotics     Patient Measurements: Height: 5\' 5"  (165.1 cm) Weight: 280 lb 12.8 oz (127.37 kg) IBW/kg (Calculated) : 57   Vital Signs: Temp: 98 F (36.7 C) (09/06 0402) Temp Source: Oral (09/06 0402) BP: 129/57 mmHg (09/06 0402) Pulse Rate: 74 (09/06 0402) Intake/Output from previous day: 09/05 0701 - 09/06 0700 In: -  Out: 900 [Urine:900] Intake/Output from this shift:    Labs:  Recent Labs  07/04/15 1702 07/05/15 0404  WBC 8.1  --   HGB 13.3  --   PLT 188  --   CREATININE 3.25* 2.73*   Estimated Creatinine Clearance: 30.2 mL/min (by C-G formula based on Cr of 2.73). No results for input(s): VANCOTROUGH, VANCOPEAK, VANCORANDOM, GENTTROUGH, GENTPEAK, GENTRANDOM, TOBRATROUGH, TOBRAPEAK, TOBRARND, AMIKACINPEAK, AMIKACINTROU, AMIKACIN in the last 72 hours.   Microbiology: No results found for this or any previous visit (from the past 720 hour(s)).  Medical History: Past Medical History  Diagnosis Date  . Oligomenorrhea     h/o  . Encounter for IUD insertion 09/15/2010 and 09/17/2005    Mirena  . PCOS (polycystic ovarian syndrome)   . Hypertension   . Type 2 diabetes mellitus   . Anovulation     chronic  . Abnormal thyroid scan     decreased thyroid  . Hx of elevated lipids   . H/O amenorrhea   . Obesity   . Actinomyces infection     h/o  . Encounter for IUD insertion 06/02/1990    Paraguard    Medications:  Prescriptions prior to admission  Medication Sig Dispense Refill Last Dose  . cloNIDine (CATAPRES) 0.3 MG tablet Take 0.3 mg by mouth 2 (two) times daily.   Taking  . glimepiride (AMARYL) 2 MG tablet Take 2 mg by mouth daily before breakfast.   Taking  . levothyroxine (SYNTHROID, LEVOTHROID) 137 MCG tablet Take 137 mcg by mouth daily.   Taking  . Olmesartan-Amlodipine-HCTZ (TRIBENZOR)  40-10-25 MG TABS Take by mouth.   Taking  . Saxagliptin-Metformin (KOMBIGLYZE XR) 2.02-999 MG TB24 Take by mouth.   Taking  . simvastatin (ZOCOR) 40 MG tablet Take 40 mg by mouth every evening.   Taking   Assessment: 59 yo female for cardiac catheterization. Has significant AKI on admission, it is unclear what her baseline SCr is. Currently Scr 3.2 > 2.7, eCrCl ~ 30 ml/min.  Plan:  Sodium bicarbonate 150 mEq in 1 L - run at 330 ml/hr for 1 hour then 120 ml/hr during catheterization and for 6 hours following cath.   Amyrah Pinkhasov, Jake Church 07/05/2015,8:31 AM

## 2015-07-06 DIAGNOSIS — I701 Atherosclerosis of renal artery: Secondary | ICD-10-CM | POA: Diagnosis not present

## 2015-07-06 LAB — HEMOGLOBIN A1C
Hgb A1c MFr Bld: 8.9 % — ABNORMAL HIGH (ref 4.8–5.6)
Mean Plasma Glucose: 209 mg/dL

## 2015-07-06 LAB — BASIC METABOLIC PANEL
Anion gap: 7 (ref 5–15)
BUN: 34 mg/dL — ABNORMAL HIGH (ref 6–20)
CHLORIDE: 103 mmol/L (ref 101–111)
CO2: 27 mmol/L (ref 22–32)
Calcium: 8.9 mg/dL (ref 8.9–10.3)
Creatinine, Ser: 2.06 mg/dL — ABNORMAL HIGH (ref 0.44–1.00)
GFR calc non Af Amer: 25 mL/min — ABNORMAL LOW (ref >60)
GFR, EST AFRICAN AMERICAN: 29 mL/min — AB (ref >60)
Glucose, Bld: 232 mg/dL — ABNORMAL HIGH (ref 65–99)
POTASSIUM: 3.7 mmol/L (ref 3.5–5.1)
SODIUM: 137 mmol/L (ref 135–145)

## 2015-07-06 LAB — CBC
HEMATOCRIT: 36.1 % (ref 36.0–46.0)
Hemoglobin: 11.6 g/dL — ABNORMAL LOW (ref 12.0–15.0)
MCH: 24.6 pg — ABNORMAL LOW (ref 26.0–34.0)
MCHC: 32.1 g/dL (ref 30.0–36.0)
MCV: 76.5 fL — AB (ref 78.0–100.0)
Platelets: 152 10*3/uL (ref 150–400)
RBC: 4.72 MIL/uL (ref 3.87–5.11)
RDW: 15.5 % (ref 11.5–15.5)
WBC: 9.4 10*3/uL (ref 4.0–10.5)

## 2015-07-06 LAB — GLUCOSE, CAPILLARY: Glucose-Capillary: 164 mg/dL — ABNORMAL HIGH (ref 65–99)

## 2015-07-06 MED ORDER — OLMESARTAN-AMLODIPINE-HCTZ 40-10-25 MG PO TABS: 0.5000 | ORAL_TABLET | Freq: Every morning | ORAL | Status: DC

## 2015-07-06 MED ORDER — CLOPIDOGREL BISULFATE 75 MG PO TABS
75.0000 mg | ORAL_TABLET | Freq: Every day | ORAL | Status: DC
Start: 2015-07-06 — End: 2019-05-11

## 2015-07-06 MED ORDER — ASPIRIN 81 MG PO CHEW: 81.0000 mg | CHEWABLE_TABLET | Freq: Every day | ORAL | Status: DC

## 2015-07-06 MED ORDER — ASPIRIN 81 MG PO CHEW
81.0000 mg | CHEWABLE_TABLET | Freq: Every day | ORAL | Status: DC
  Administered 2015-07-06: 10:00:00 81 mg via ORAL
  Filled 2015-07-06: qty 1

## 2015-07-06 MED FILL — Heparin Sodium (Porcine) 2 Unit/ML in Sodium Chloride 0.9%: INTRAMUSCULAR | Qty: 1000 | Status: AC

## 2015-07-06 NOTE — Discharge Summary (Signed)
Physician Discharge Summary  Patient ID: Julie [name] MRN: [id number] DOB/AGE: 1956/01/30 59 y.o.  Admit date: 07/04/2015 Discharge date: 07/06/2015  Primary Discharge Diagnosis Renovascular hypertension Bilateral renal artery stenosis, successful PTA and stenting with 6.0 x 15 mm Cordis blue balloon-expandable stent on 07/05/2015   Secondary Discharge Diagnosis Diabetes mellitus type 2 uncontrolled Hypertension Hyperlipidemia Diabetes mellitus with chronic kidney disease stage III Morbid obesity  Significant Diagnostic Studies:  07/05/2015: Procedure performed: CO2 abdominal angiogram, selective right and left renal arteriogram, PTA and stenting of bilateral renal arteries for high-grade 90% stenosis with a greater than 60 mmHg pressure gradient across both renal artery ostia, successful implantation bilaterally with 6 x 15 mm balloon expandable blue stents. Stenosis reduced from 90% to 0%. 35 mL of contrast was utilized for angiography, 140 mL of CO2.  Hospital Course: Patient admitted 1 day prior to procedure for hydration, due to renal insufficiency. Her antihypertensives medications were held 1 day prior to the procedure, the following day she underwent renal arteriogram and successful angioplasty for high-grade stenosis. She did have a very small hematoma right groin, due to unsuccessful deployment of Perclose and hence manual pressure and FemoStop had to be applied. The following morning, she remained stable, ambulated in the hallway without any complications or problems, blood pressure improved significantly, felt stable for discharge.  Patient was on 7 antihypertensive medications prior to admission, spironolactone was discontinued at discharge but was continued on other medications. Expect improvement in serum creatinine are also blood pressure control. Extensive discussion regarding dietary changes and lifestyle changes that need to be performed.  Recommendations on discharge: Patient  will need Plavix for at least a period of 9 months to a year since his renal angioplasty and stenting. She probably has severe peripheral arterial disease with very faint lower 20 pulses which was felt only by Dopplers. Probably needs further follow-up regarding this if symptomatic. Weight loss was extensively discussed. Needs diabetes mellitus control. I have also decreased the dose of Tribenzor from 40/10/25 to one half tablet daily. I will perform BMP in 1 week prior to office visit, we'll also set her up for outpatient renal duplex to establish baseline.  Discharge Exam: Blood pressure 161/62, pulse 72, temperature 98 F (36.7 C), temperature source Oral, resp. rate 22, height 5\' 5"  (1.651 m), weight 131 kg (288 lb 12.8 oz), SpO2 100 %.   General appearance: alert, cooperative, appears older than stated age and no distress Resp: clear to auscultation bilaterally Chest wall: no tenderness GI: soft, non-tender; bowel sounds normal; no masses,  no organomegaly and obese with large pannus Extremities: extremities normal, atraumatic, no cyanosis or edema Pulses: Right groin site without significant tenderness or hematoma, there is a bruit heard in the right femoral artery but without any pulsatile mass. Left femoral artery very distant and feeble, bilateral popliteals could not be felt and absent pedal pulses bilaterally. Carotid pulse 2+ bilaterally without any bruit. Neurologic: Grossly normal Labs:   Lab Results  Component Value Date   WBC 9.4 07/06/2015   HGB 11.6* 07/06/2015   HCT 36.1 07/06/2015   MCV 76.5* 07/06/2015   PLT 152 07/06/2015    Recent Labs Lab 07/06/15 0235  NA 137  K 3.7  CL 103  CO2 27  BUN 34*  CREATININE 2.06*  CALCIUM 8.9  GLUCOSE 232*   HEMOGLOBIN A1C Lab Results  Component Value Date   HGBA1C 8.9* 07/04/2015   MPG 209 07/04/2015   EKG: 07/05/2015: Normal sinus rhythm, rightward axis,  otherwise no evidence of ischemia, normal EKG.   FOLLOW UP PLANS  AND APPOINTMENTS    Medication List    STOP taking these medications        spironolactone 50 MG tablet  Commonly known as:  ALDACTONE      TAKE these medications        aspirin 81 MG chewable tablet  Chew 1 tablet (81 mg total) by mouth daily.     atenolol 50 MG tablet  Commonly known as:  TENORMIN  Take 50 mg by mouth daily.     cloNIDine 0.3 MG tablet  Commonly known as:  CATAPRES  Take 0.3 mg by mouth 2 (two) times daily.     clopidogrel 75 MG tablet  Commonly known as:  PLAVIX  Take 1 tablet (75 mg total) by mouth daily with breakfast.     ergocalciferol 50000 UNITS capsule  Commonly known as:  VITAMIN D2  Take 50,000 Units by mouth once a week.     glimepiride 2 MG tablet  Commonly known as:  AMARYL  Take 2 mg by mouth daily before breakfast.     KOMBIGLYZE XR 2.02-999 MG Tb24  Generic drug:  Saxagliptin-Metformin  Take by mouth.     levothyroxine 137 MCG tablet  Commonly known as:  SYNTHROID, LEVOTHROID  Take 137 mcg by mouth daily.     Olmesartan-Amlodipine-HCTZ 40-10-25 MG Tabs  Commonly known as:  TRIBENZOR  Take 0.5 tablets by mouth every morning.     simvastatin 40 MG tablet  Commonly known as:  ZOCOR  Take 40 mg by mouth every evening.           Follow-up Information    Follow up with Adrian Prows, MD.   Specialty:  Cardiology   Why:  Keep previous appointment. You need blood draw in 1 week at [facility] information:   [address]  [phone]        Adrian Prows, MD 07/06/2015, 8:55 AM  Pager: [phone] Office: [phone] If no answer: [phone]

## 2015-08-29 ENCOUNTER — Encounter (HOSPITAL_COMMUNITY): Payer: BC Managed Care – PPO

## 2015-08-29 ENCOUNTER — Encounter: Payer: BC Managed Care – PPO | Admitting: Surgery

## 2015-09-28 ENCOUNTER — Encounter: Payer: Self-pay | Admitting: Surgery

## 2015-09-29 ENCOUNTER — Ambulatory Visit (INDEPENDENT_AMBULATORY_CARE_PROVIDER_SITE_OTHER): Payer: BC Managed Care – PPO | Admitting: Family Medicine

## 2015-09-29 ENCOUNTER — Encounter: Payer: Self-pay | Admitting: Family Medicine

## 2015-09-29 VITALS — Ht 64.5 in | Wt 288.0 lb

## 2015-09-29 DIAGNOSIS — E119 Type 2 diabetes mellitus without complications: Secondary | ICD-10-CM

## 2015-09-29 NOTE — Progress Notes (Signed)
Medical Nutrition Therapy:  Appt start time: 1330 end time:  1430. PCP: Julie Ada, MD; Julie [name] at [facility] ([facility] fax all visit note to Dr. Tresa Endo: [phone])  Assessment:  Primary concerns today: Weight management and Blood sugar control.  [name] had a renal stent placed in each kidney on Sept 6, 2016.  Since that time, she has been worried about what she eats.  Dr. Einar Gip emphasized controlling blood sugars and blood pressure, but with no real guidance on food choices other than a list of glycemic index of foods.  She is also concerned about losing weight and about managing cholesterol, and she has a h/o constipation.    [name] is not checking BG; last checked ~2 wks ago: 95, but can't remember what time of day it was.  Last A1C was 7.4.    Learning Readiness: Ready; pt seemed especially motivated by the end of today's appt.    Barriers to learning/adherence to lifestyle change: Not sure [name] has good social support for these lifestyle changes recommended, i.e., no other family members are physically active.    Usual eating pattern includes 3 meals and 0-2 snacks per day. Frequent foods and beverages include lemonade (160 kcal), [place] sub (minus part of bread), chx, rice, low-sugar instant oatmeal, sk milk, apples.  Avoided foods include red meat, pickles, liver and pork (b/c of cholesterol).   Usual physical activity includes none currently b/c of knee pain, which started in Jan 2016.  Did not set any specific goal related to exercise today, but did encourage pt to start to walk or use stationary bike as tolerated, and to see a sports med physician if knee pain returns.    24-hr recall: (Up at 6:30 AM) B (7:45 AM)-   [place] chx biscuit, 12 oz swt tea Snk ( AM)-    L (12:30 PM)-  1 salad, 4 oz feta chs , 2 T salad drsng, 2 slc bread 12 oz swt tea Snk ( PM)-   D (8:30 PM)-  1 [employer] fried chx thigh, 1/2 c sauteed broc&carrots, 1/2 c rice-a-roni Snk ( PM)-  water  Typical day? No.   Usually eats breakfast at home, i.e., oatmeal; Raisin Bran Crunch w/ sk milk; or [place] bacon,  eggs, & apple sauce or toast.    Progress Towards Goal(s):  In progress.   Nutritional Diagnosis:  NI-5.8.3 Inappropriate intake of types of carbohydrates (specify): (sugar) As related to beverages.  As evidenced by usual intake of SSB such as lemonade and sweet tea.    Intervention:  Nutrition edcuation.  Handouts given during visit include:  AVS  Holidays handout  FBG log  Demonstrated degree of understanding via:  Teach Back   Monitoring/Evaluation:  Dietary intake, exercise, FBG, and body weight in 6 week(s).

## 2015-09-29 NOTE — Patient Instructions (Addendum)
-   Talk to Dr. Chalmers Cater about your vitamin supplement.  My recommendation is to make sure you are taking a D3 supplement (which is available in 50K units as Replesta) either as 50,000 per week or as 5-7,000 units daily.  I am suggesting this b/c you have been on the high-dose vitamin for so long with little improvement.   - Recommendation for your knees:  See a Sports Med doc before you consult with an orthopedic surgeon.  Eustace Moore [facility] Fries, or Yvetta Coder at [facility] or Charlann Boxer at [facility].) - Please review the handout on getting through the holidays provided today, AND put this in a place where you can review it several times over the next few weeks.   - Please check some fasting blood glucose levels at least 3 X wk, AND WRITE DOWN YOUR NUMBERS on the form provided!   Diet Recommendations for Diabetes   Starchy (carb) foods: Bread, rice, pasta, potatoes, corn, cereal, grits, crackers, bagels, muffins, all baked goods.  (Fruits, milk, and plain yogurt also have carbohydrate, but most of these foods will not spike your blood sugar as the starchy foods will.)  A few fruits do cause high blood sugars; use small portions of bananas (limit to 1/2 at a time), grapes, watermelon, oranges, and most tropical fruits.  Any of the berries are some of the best fruits!  (You can test how these foods affect your BG: Measure your BG 90 min after eating.  It should be down close to 120 if you just had a piece of fruit.)  If you like yogurt, but don't want all the sugar in the sweetened varieties, mix the fruit yogurt with plain, 50:50.  (Please remember that TASTE PREFERENCES ARE LEARNED.  This means that it will get easier to choose foods you know are good for you if you are exposed to them enough.)     Protein foods: Meat, fish, poultry, eggs, dairy foods, and beans such as pinto and kidney beans (beans also provide carbohydrate).   1. Eat at least 3 meals and 1-2 snacks per day. Never  go more than 4-5 hours while awake without eating. Eat breakfast within the first hour of getting up.   2. Limit starchy foods to TWO per meal and ONE per snack. ONE portion of a starchy  food is equal to the following:   - ONE slice of bread (or its equivalent, such as half of a hamburger bun).   - 1/2 cup of a "scoopable" starchy food such as potatoes or rice.   - 15 grams of carbohydrate as shown on food label.   - Remember that sweet drinks are VERY effective in raising BG!  Drink mostly water.   3. Include at every meal: a protein food, a carb food, and vegetables and/or fruit.   - Obtain twice the volume of veg's as protein or carbohydrate foods for both lunch and dinner.   - Fresh or frozen veg's are best.   - Keep frozen veg's on hand for a quick vegetable serving.

## 2015-09-30 ENCOUNTER — Other Ambulatory Visit: Payer: Self-pay | Admitting: *Deleted

## 2015-09-30 DIAGNOSIS — I6523 Occlusion and stenosis of bilateral carotid arteries: Secondary | ICD-10-CM

## 2015-10-03 ENCOUNTER — Ambulatory Visit (HOSPITAL_COMMUNITY)
Admission: RE | Admit: 2015-10-03 | Discharge: 2015-10-03 | Disposition: A | Discharge status: Home / Self Care | Payer: BC Managed Care – PPO | Source: Ambulatory Visit | Attending: Surgery | Admitting: Surgery

## 2015-10-03 ENCOUNTER — Ambulatory Visit (INDEPENDENT_AMBULATORY_CARE_PROVIDER_SITE_OTHER): Payer: BC Managed Care – PPO | Admitting: Surgery

## 2015-10-03 ENCOUNTER — Encounter: Payer: Self-pay | Admitting: Surgery

## 2015-10-03 VITALS — BP 187/81 | HR 81 | Temp 98.6°F | Resp 18 | Ht 64.5 in | Wt 285.0 lb

## 2015-10-03 DIAGNOSIS — I10 Essential (primary) hypertension: Secondary | ICD-10-CM | POA: Diagnosis not present

## 2015-10-03 DIAGNOSIS — I6523 Occlusion and stenosis of bilateral carotid arteries: Secondary | ICD-10-CM | POA: Insufficient documentation

## 2015-10-03 DIAGNOSIS — E119 Type 2 diabetes mellitus without complications: Secondary | ICD-10-CM | POA: Insufficient documentation

## 2015-10-03 NOTE — Progress Notes (Signed)
Patient name: Julie [name] MRN: [id number] DOB: 10-23-1956 Sex: female   Referred by: Dr. Einar Gip  Reason for referral:  Chief Complaint  Patient presents with  . New Evaluation    Ref. by Dr. Nadyne Coombes  C/O Left Carotid stenosis    Left neck discomfort,  level 3-4     HISTORY OF PRESENT ILLNESS: This is a pleasant 59 year old female who comes in today for evaluation of carotid stenosis.  This was detected by Dr. Einar Gip during workup.  She has recently undergone bilateral renal artery stenting secondary to poorly controlled hypertension despite multiple medications.  She is currently weaning down off of her blood pressure medicines.  She takes a statin for hypercholesterolemia.  She has a history of premature cardiac disease in her family.  Both of her parents died age 73 of a heart attack.  The patient also suffers from type 2 diabetes.  She denies having any neurologic symptoms.  Specifically, she denies numbness or weakness in either extremity.  She denies third speech pitching denies amaurosis fugax.  She currently is on dual antiplatelet therapy with aspirin and Plavix.  She denies any cardiac symptoms.  Past Medical History  Diagnosis Date  . Oligomenorrhea     h/o  . Encounter for IUD insertion 09/15/2010 and 09/17/2005    Mirena  . PCOS (polycystic ovarian syndrome)   . Hypertension   . Type 2 diabetes mellitus ([place])   . Anovulation     chronic  . Abnormal thyroid scan     decreased thyroid  . Hx of elevated lipids   . H/O amenorrhea   . Obesity   . Actinomyces infection     h/o  . Encounter for IUD insertion 06/02/1990    Paraguard    Past Surgical History  Procedure Laterality Date  . Wisdom tooth extraction  1976  . Thyroid exploration      PT STATES HAD PROCEDURE DONE ON THYROID  . Peripheral vascular catheterization N/A 07/05/2015    Procedure: Renal Angiography;  Surgeon: Adrian Prows, MD;  Location: [facility];  Service: Cardiovascular;  Laterality: N/A;    . Peripheral vascular catheterization Bilateral 07/05/2015    Procedure: Renal Intervention;  Surgeon: Adrian Prows, MD;  Location: [facility];  Service: Cardiovascular;  Laterality: Bilateral;  right and left renal stent placement    Social History   Social History  . Marital Status: Single    Spouse Name: N/A  . Number of Children: N/A  . Years of Education: N/A   Occupational History  . Not on file.   Social History Main Topics  . Smoking status: Never Smoker   . Smokeless tobacco: Never Used  . Alcohol Use: No  . Drug Use: No  . Sexual Activity: Yes    Birth Control/ Protection: IUD     Comment: MIRENA   Other Topics Concern  . Not on file   Social History Narrative    Family History  Problem Relation Age of Onset  . Hypertension Mother   . Diabetes Mother   . Hypertension Maternal Grandmother   . Hypertension Paternal Grandmother   . Diabetes Father     Allergies as of 10/03/2015 - Review Complete 10/03/2015  Allergen Reaction Noted  . Sulfa antibiotics Rash and Other (See Comments) 07/04/2015    Current Outpatient Prescriptions on File Prior to Visit  Medication Sig Dispense Refill  . aspirin 81 MG chewable tablet Chew 1 tablet (81 mg total)  by mouth daily.    Marland Kitchen atenolol (TENORMIN) 100 MG tablet Take 100 mg by mouth daily.    . cloNIDine (CATAPRES) 0.3 MG tablet Take 0.2 mg by mouth 2 (two) times daily.     . clopidogrel (PLAVIX) 75 MG tablet Take 1 tablet (75 mg total) by mouth daily with breakfast. 90 tablet 1  . ergocalciferol (VITAMIN D2) 50000 UNITS capsule Take 50,000 Units by mouth once a week.    . insulin aspart (NOVOLOG) 100 UNIT/ML injection Inject 20 Units into the skin 3 (three) times daily before meals. 20 units at breakfast; 15 units at dinner.    . levothyroxine (SYNTHROID, LEVOTHROID) 137 MCG tablet Take 137 mcg by mouth daily.    . Olmesartan-Amlodipine-HCTZ (TRIBENZOR) 40-10-25 MG TABS Take 0.5 tablets by mouth every morning. 30  tablet   . rosuvastatin (CRESTOR) 40 MG tablet Take 40 mg by mouth daily.    . Saxagliptin-Metformin (KOMBIGLYZE XR) 2.02-999 MG TB24 Take by mouth.     No current facility-administered medications on file prior to visit.     REVIEW OF SYSTEMS: See history of present illness, otherwise negative  PHYSICAL EXAMINATION:  Filed Vitals:   10/03/15 1503 10/03/15 1507 10/03/15 1517 10/03/15 1518  BP: 186/86 177/95 186/92 187/81  Pulse: 87 88 86 81  Temp:  98.6 F (37 C)    TempSrc:  Oral    Resp:  18    Height:  5' 4.5" (1.638 m)    Weight:  285 lb (129.275 kg)    SpO2:  100%     Body mass index is 48.18 kg/(m^2). General: The patient appears their stated age.   HEENT:  No gross abnormalities Pulmonary: Respirations are non-labored Abdomen: Soft and non-tender  Musculoskeletal: There are no major deformities.   Neurologic: No focal weakness or paresthesias are detected, Skin: There are no ulcer or rashes noted. Psychiatric: The patient has normal affect. Cardiovascular: There is a regular rate and rhythm without significant murmur appreciated. Heart bilateral carotid bruits  Diagnostic Studies: The following are reports from Dr.  Einar Gip' s office:  Echocardiogram08/11/2014 1. Left ventricle cavity is normal in size. Moderate concentric hypertrophy of the left ventricle. Normal global wall motion. Calculated EF 68%. 2. Mild mitral regurgitation. 3. Mild tricuspid regurgitation. No evidence of pulmonary hypertension. Nuclear stress test07/22/2016 1. The resting electrocardiogram demonstrated normal sinus rhythm, incomplete RBBB and no resting arrhythmias. Stress EKG is non-diagnostic for ischemia as it a pharmacologic stress using Lexiscan. 2. Myocardial perfusion imaging is normal. Overall left ventricular systolic function was normal without regional wall motion abnormalities. The left ventricular ejection fraction was 64%. Carotid Doppler08/12/2014 Moderate stenosis in the right  internal carotid artery (50-69%). Mild stenosis in the right common carotid artery (<50%). Moderate stenosis in the left external carotid artery (>50%). Severe stenosis in the left internal carotid artery (>70%). Severe stenosis in the left common carotid artery (>50%) probably in the range > 80-99%. Heavy soft plaque burden throughout the bilateral carotid arteries. Consider further work-up. Follow-up in 6 months if clinically indicated.  Carotid Doppler studies were repeated here.  The right internal carotid artery velocities suggest a 40-59 percent stenosis there is a high-grade stenosis in the distal left common carotid artery/bifurcation the distal internal carotid artery and left could not be visualized given high bifurcation   Assessment:  Carotid stenosis, left greater than right Plan: I discussed the ultrasound findings today with the patient and her family.  She is asymptomatic.  Ultrasound suggest significant lesion  in the left side.  I am concerned that the bifurcation may be very high, making surgical endarterectomy very challenging.  I think this needs to be determined before taking her to the operating room.  I have ordered a CT angiogram of the neck to determine the true extent of the disease and stenosis as well as the location of the carotid bifurcation.  The patient understands that if this does not completely resolve questions, she will need to undergo catheter-based angiography for definitive evaluation.  She will follow-up with me in the next several weeks after the CAT scan has been performed.     Eldridge Abrahams, M.D. [facility] Office: [phone] Pager:  [phone]

## 2015-10-03 NOTE — Progress Notes (Signed)
Filed Vitals:   10/03/15 1503 10/03/15 1507 10/03/15 1517 10/03/15 1518  BP: 186/86 177/95 186/92 187/81  Pulse: 87 88 86 81  Temp:  98.6 F (37 C)    TempSrc:  Oral    Resp:  18    Height:  5' 4.5" (1.638 m)    Weight:  285 lb (129.275 kg)    SpO2:  100%

## 2015-10-03 NOTE — Addendum Note (Signed)
Addended by: Dorthula Rue L on: 10/03/2015 04:55 PM   Modules accepted: Orders

## 2015-10-07 ENCOUNTER — Other Ambulatory Visit: Payer: Self-pay | Admitting: Surgery

## 2015-10-07 LAB — CREATININE, SERUM: Creat: 1.58 mg/dL — ABNORMAL HIGH (ref 0.50–1.05)

## 2015-10-12 ENCOUNTER — Inpatient Hospital Stay
Admission: RE | Admit: 2015-10-12 | Discharge: 2015-10-12 | Disposition: A | Discharge status: Home / Self Care | Payer: BC Managed Care – PPO | Source: Ambulatory Visit | Attending: Surgery | Admitting: Surgery

## 2015-10-13 ENCOUNTER — Other Ambulatory Visit: Payer: Self-pay | Admitting: *Deleted

## 2015-10-13 ENCOUNTER — Ambulatory Visit
Admission: RE | Admit: 2015-10-13 | Discharge: 2015-10-13 | Disposition: A | Discharge status: Home / Self Care | Payer: BC Managed Care – PPO | Source: Ambulatory Visit | Attending: Surgery | Admitting: Surgery

## 2015-10-13 DIAGNOSIS — I6523 Occlusion and stenosis of bilateral carotid arteries: Secondary | ICD-10-CM

## 2015-10-13 DIAGNOSIS — Z9862 Peripheral vascular angioplasty status: Secondary | ICD-10-CM

## 2015-10-13 MED ORDER — IOPAMIDOL (ISOVUE-370) INJECTION 76%
60.0000 mL | Freq: Once | INTRAVENOUS | Status: AC | PRN
  Administered 2015-10-13: 60 mL via INTRAVENOUS

## 2015-10-15 LAB — CREATININE, SERUM: Creat: 2.12 mg/dL — ABNORMAL HIGH (ref 0.50–1.05)

## 2015-10-17 ENCOUNTER — Inpatient Hospital Stay: Admission: RE | Admit: 2015-10-17 | Payer: BC Managed Care – PPO | Source: Ambulatory Visit

## 2015-10-21 ENCOUNTER — Telehealth: Payer: Self-pay | Admitting: Surgery

## 2015-10-21 ENCOUNTER — Other Ambulatory Visit: Payer: Self-pay | Admitting: *Deleted

## 2015-10-21 DIAGNOSIS — Z01812 Encounter for preprocedural laboratory examination: Secondary | ICD-10-CM

## 2015-10-21 NOTE — Telephone Encounter (Signed)
Tried to reach pt to make aware, but no answer- will try to call again Tuesday to make pt aware of need for repeat creatinine. dpm

## 2015-10-21 NOTE — Telephone Encounter (Signed)
Sent to [facility] folks for an order. Will call pt once order is in the system. dpm

## 2015-10-21 NOTE — Telephone Encounter (Signed)
-----   Message from Angelia Mould, MD sent at 10/16/2015  6:43 PM EST ----- Regarding: abnormal lab Though lab called me about this patient because her creatinine was 2.12 on 10/13/2015. She was seen by Dr. Annamarie Major and set up for a CT angiogram. It looks like the angiogram was done despite her creatinine. She should have a follow up creatinine before she sees Dr. Annamarie Major for follow up. Thanks CD

## 2015-10-21 NOTE — Telephone Encounter (Signed)
-----   Message from Mena Goes, RN sent at 10/21/2015 12:40 PM EST ----- Regarding: RE: abnormal lab i put this in and then released it so they can see it.  ----- Message -----    From: Gena Fray    Sent: 10/21/2015  11:45 AM      To: [facility] Clinical Pool Subject: FW: abnormal lab                               Please enter order for repeat creatinine as per CSD below.  Hinton Dyer  ----- Message -----    From: Angelia Mould, MD    Sent: 10/16/2015   6:43 PM      To: [facility] Pool Subject: abnormal lab                                   Though lab called me about this patient because her creatinine was 2.12 on 10/13/2015. She was seen by Dr. Annamarie Major and set up for a CT angiogram. It looks like the angiogram was done despite her creatinine. She should have a follow up creatinine before she sees Dr. Annamarie Major for follow up. Thanks CD

## 2015-11-08 ENCOUNTER — Ambulatory Visit: Payer: BC Managed Care – PPO | Admitting: Family Medicine

## 2015-11-11 ENCOUNTER — Encounter: Payer: Self-pay | Admitting: Surgery

## 2015-11-21 ENCOUNTER — Ambulatory Visit: Payer: BC Managed Care – PPO | Admitting: Surgery

## 2015-12-11 DIAGNOSIS — I701 Atherosclerosis of renal artery: Secondary | ICD-10-CM | POA: Diagnosis present

## 2015-12-11 NOTE — H&P (Addendum)
OFFICE VISIT NOTES COPIED TO EPIC FOR DOCUMENTATION  . Julie [name] 2015/12/02 8:19 AM Location: [facility] Patient #: [id number] DOB: September 09, 1956 Married / Language: English / Race: Black or African American Female   History of Present Illness Julie Page MD; 02-Dec-2015 5:39 PM) Patient words: Last o/v 10-27-2015; f/u for HTN, renal failure;Marland Kitchen  The patient is a 60 year old female who presents for a Follow-up for Hypertension. She has morbid obesity, difficult to control hypertension, uncontrolled diabetes mellitus, hypothyroidism, and carotid artery stenosis. Due to difficulty in controlling blood pressure, she underwent renal angiography on 07/02/2015 and was found to have bilateral high-grade renal artery stenosis of 90% for which he underwent balloon angioplasty and stenting with 6 x 15 mm Cordis blue stents.   Renal function had significantly decreased after starting spironolactone and hence discontinued. However due to continued renal insufficiency Tribenzor(on it chronically) was discontinued and serum creatinine improved. Tribenzor was restarted at a lower dose and renal function had remained stable, however due to recent worsening in renal function, ARB has been discontinued.  Underwent renal duplex and present for f/u.   Problem List/Past Medical [name]; 02-Dec-2015 1:31 PM) Renovascular hypertension (I15.0) Renal artery angiogram 07/02/2015: Bilateral RAS of 80-90% to 0% with 6x65m Cordis Blue stents. Hypercholesteremia (E78.00) Uncontrolled type 2 diabetes mellitus without complication, with long-term current use of insulin (E11.65) Shortness of breath on exertion (R06.02) Echo- 05/31/2015 1. Left ventricle cavity is normal in size. Moderate concentric hypertrophy of the left ventricle. Normal global wall motion. Calculated EF 68%. 2. Mild mitral regurgitation. 3. Mild tricuspid regurgitation. No evidence of pulmonary hypertension. Lexiscan myoview  stress test 05/20/2015: 1. The resting electrocardiogram demonstrated normal sinus rhythm, incomplete RBBB and no resting arrhythmias. Stress EKG is non-diagnostic for ischemia as it a pharmacologic stress using Lexiscan. 2. Myocardial perfusion imaging is normal. Overall left ventricular systolic function was normal without regional wall motion abnormalities. The left ventricular ejection fraction was 64%. Morbid obesity with BMI of 45.0-49.9, adult (E66.01) Family history of premature CAD (Z82.49) Both parents died at age [age]from CAD Abnormal EKG (R94.31) Asymptomatic bilateral carotid artery stenosis ((N27.78 Vascular office visit 10/03/2015: Evaluation for carotid artery stenosis. Scheduled for CTA of the neck to determine extent of carotid artery stenosis and location left carotid bifurcation. Follow up after CT. Carotid artery duplex 06/01/2015: Moderate stenosis in the right internal carotid artery (50-69%). Mild stenosis in the right common carotid artery (<50%). Moderate stenosis in the left external carotid artery (>50%). Severe stenosis in the left internal carotid artery (>70%). Severe stenosis in the left common carotid artery (>50%) probably in the range > 80-99%. Heavy soft plaque burden throughout the bilateral carotid arteries. Consider further work-up. Follow-up in 6 months if clinically indicated. Pruritus (L29.9) Sciatica (M54.30) Hypothyroidism, adult (E03.9)  Allergies (Anderson MaltaSergeant; 1February 03, 20171:31 PM) Toprol XL *BETA BLOCKERS* Glimepiride *ANTIDIABETICS* Sulfa Drugs  Family History (Anderson MaltaSergeant; 102/03/20171:31 PM) Mother Deceased. at age 82563from MI; no strokes or MIs prior to this event; HTN but no other cardiovascular conditions Father Deceased. at age 82541from MI; no strokes or MIs prior to this event; no other cardiovascular conditions Sister 1 In good health. 5 yrs older; no cardiovascular conditions  Social History (Anderson MaltaSergeant; 102/12/2015 1:31 PM) Current tobacco use Never smoker. Non Drinker/No Alcohol Use Marital status Married. Living Situation Lives with spouse. Number of Children 2.  Past Surgical History (Anderson MaltaSergeant; 102-03-20171:31 PM) None07/04/2015  Medication History (Anderson MaltaSergeant; 103-Feb-2017  1:39 PM) AmLODIPine Besylate (10MG Tablet, 1 (one) Tablet Oral daily, Taken starting 10/27/2015) Active. (Discontinue Tribenzor- renal failure) Atenolol (100MG Tablet, 1 Tablet Oral twice a day, Taken starting 10/27/2015) Active. (Discontinue Tribenzor- renal failure) Chlorthalidone (25MG Tablet, 1 (one) Tablet Oral every morning, Taken starting 10/27/2015) Active. (Discontinue Tribenzor- renal failure) HydrOXYzine HCl (50MG Tablet, 1 (one) Tablet Tablet Tablet Oral at bedtime as needed, Taken starting 08/15/2015) Active. Crestor (40MG Tablet, 1 (one) Tablet Tablet Tablet T Oral daily, Taken starting 06/10/2015) Active. Aspirin Adult Low Dose (81MG Tablet DR, 1 (one) Tablet DR Tablet DR Ta Oral daily, Taken starting 05/05/2015) Active. CloNIDine HCl (0.2MG Tablet, 1 Oral two times daily) Active. Levothyroxine Sodium (137MCG Tablet, 1 Oral daily) Active. NovoLOG Mix 70/30 FlexPen ((70-30) 100UNIT/ML Susp Pen-inj, inject 20 units in morning Subcutaneous 10 units before dinner) Active. Zioptan (0.0015% Solution, 1 drop each eye Ophthalmic daily) Active. Clopidogrel Bisulfate (75MG Tablet, 1 Oral daily) Active. Medications Reconciled (verbally)  Diagnostic Studies History [name]; 11/14/2015 1:31 PM) [name] 11/10/2015: Serum glucose 263, BUN 27, creatinine 2.12, eGFR 29, potassium 3.1 10/12/2015: serum glucose 149, creatinine 2.0, eGFR 33, potassium 3.7, HbA1c 7.2%, total cholesterol 130, triglycerides 132, HDL 34, LDL 70, hepatic function normal, TSH 1.030, Vitamin D 47.2 08/16/2015: Serum glucose 179 mg, BUN 21, serum creatinine 1.53, eGFR 37 mL. Potassium 3.9. Compared to 07/12/2015, serum  creatinine 1.72, eGFR 32 mL. Serum glucose 212. Labs are stable. 06/29/2015: BUN 50, creatinine 2.48, eGFR 24, potassium 4.8, H&H normal with microcytic indices, CBC otherwise PTT 10.5, INR 1.0 04/12/2015: total cholesterol 281, triglycerides 142, HDL 38, LDL 215, TSH 1.810, HbA1c 7.7%, serum glucose 162, BUN 19, creatinine 1.1, potassium 4.0 Echocardiogram2006 Treadmill stress test2006 Nuclear stress test07/22/2016 1. The resting electrocardiogram demonstrated normal sinus rhythm, incomplete RBBB and no resting arrhythmias. Stress EKG is non-diagnostic for ischemia as it a pharmacologic stress using Lexiscan. 2. Myocardial perfusion imaging is normal. Overall left ventricular systolic function was normal without regional wall motion abnormalities. The left ventricular ejection fraction was 64%. Carotid Doppler08/12/2014 Moderate stenosis in the right internal carotid artery (50-69%). Mild stenosis in the right common carotid artery (<50%). Moderate stenosis in the left external carotid artery (>50%). Severe stenosis in the left internal carotid artery (>70%). Severe stenosis in the left common carotid artery (>50%) probably in the range > 80-99%. Heavy soft plaque burden throughout the bilateral carotid arteries. Consider further work-up. Follow-up in 6 months if clinically indicated. Renal Artery Angiogram09/12/2014 Bilateral RAS of 80-90% to 0% with 6x72m Cordis Blue stents. CT Scan of Chest12/16/2016    Review of Systems (Julie Page MD; 11/14/2015 5:44 PM) General Not Present- Anorexia, Fatigue and Fever. Respiratory Present- Decreased Exercise Tolerance and Difficulty Breathing on Exertion. Not Present- Cough. Cardiovascular Present- Edema. Not Present- Chest Pain, Claudications, Orthopnea, Paroxysmal Nocturnal Dyspnea and Shortness of Breath. Gastrointestinal Not Present- Change in Bowel Habits, Constipation and Nausea. Neurological Not Present- Focal Neurological  Symptoms. Endocrine Not Present- Appetite Changes, Cold Intolerance and Heat Intolerance. Hematology Not Present- Anemia, Petechiae and Prolonged Bleeding.  Vitals (Julie PageMD; 11/14/2015 2:11 PM) 11/14/2015 2:11 PM Weight: 272.38 lb Height: 64.5in Body Surface Area: 2.24 m Body Mass Index: 46.03 kg/m  BP: 180/70 (Sitting, Left Arm, Large)    11/14/2015 1:32 PM Weight: 272.38 lb Height: 64.5in Body Surface Area: 2.24 m Body Mass Index: 46.03 kg/m  Pulse: 63 (Regular)  P.OX: 97% (Room air) BP: 211/91 (Sitting, Left Arm, Large)       Physical Exam (  Julie Page, MD; 11/14/2015 5:44 PM) General Mental Status-Alert. General Appearance-Cooperative, Appears stated age, Not in acute distress. Orientation-Oriented X3. Build & Nutrition-Moderately built and Morbidly obese.  Head and Neck Thyroid Gland Characteristics - no palpable nodules, no palpable enlargement.  Chest and Lung Exam Palpation Tender - No chest wall tenderness.  Cardiovascular Cardiovascular examination reveals -normal heart sounds, regular rate and rhythm with no murmurs. Inspection Jugular vein - Right - No Distention.  Abdomen Inspection Contour - Obese and Pannus present. Palpation/Percussion Normal exam - Non Tender and No hepatosplenomegaly. Auscultation Normal exam - Bowel sounds normal.  Peripheral Vascular Lower Extremity Inspection - Left - No Pigmentation, No Varicose veins. Right - No Pigmentation, No Varicose veins. Palpation - Edema - Left - Trace edema. Right - Trace edema. Right - 2+, Bruit, 2+ and Bruit. Note: small "knot" at access site, no pain or tenderness. Popliteal pulse - Left - Normal. Right - Normal. Dorsalis pedis pulse - Left - Normal. Right - Normal. Posterior tibial pulse - Left - Normal. Right - Normal. Carotid arteries - Bilateral-Harsh Bruit and Carotid bruit. Abdomen-No prominent abdominal aortic pulsation, No epigastric  bruit.  Neurologic Motor-Grossly intact without any focal deficits.  Musculoskeletal Global Assessment Left Lower Extremity - normal range of motion without pain. Right Lower Extremity - normal range of motion without pain.  Assessment & Plan Julie Page MD; 11/14/2015 5:44 PM)  Renovascular hypertension (I15.0) Story: Renal artery angiogram 07/02/2015: Bilateral RAS of 80-90% to 0% with 6x77m Cordis Blue stents.  Renal artery duplex 11/08/2015: Hemodynamically significant stenosis of the right renal artery suggesting restenosis in previously stented renal artery. Left renal artery stent appears patent. Normal intrarenal vascular perfusion is noted in both kidneys. Diffuse plaque noted in the abdominal aorta.  Current Plans Started Potassium Chloride ER 20MEQ, 1 (one) Tablet every morning with chlorthalidone, #30, 11/14/2015, Ref. x2. Discontinued CloNIDine HCl 0.2MG (Will swiitch to TTS patch). Started CloNIDine HCl 0.3MG/24HR, 1 (one) Patch weekly, 4 Patch, 11/14/2015, Ref. x2. Local Order: Discontinue catapres tablets- efficacy Future Plans 21/01/4314 METABOLIC PANEL, BASIC (840086 - one time 12/07/2015: CBC & PLATELETS (AUTO) ((76195 - one time  Asymptomatic bilateral carotid artery stenosis ((K93.26 Story: Vascular office visit 10/03/2015: Evaluation for carotid artery stenosis. Scheduled for CTA of the neck to determine extent of carotid artery stenosis and location left carotid bifurcation. Follow up after CT.  Carotid artery duplex 06/01/2015: Moderate stenosis in the right internal carotid artery (50-69%). Mild stenosis in the right common carotid artery (<50%). Moderate stenosis in the left external carotid artery (>50%). Severe stenosis in the left internal carotid artery (>70%). Severe stenosis in the left common carotid artery (>50%) probably in the range > 80-99%. Heavy soft plaque burden throughout the bilateral carotid arteries. Consider further work-up.  Follow-up in 6 months if clinically indicated. Labwork Story: 11/10/2015: Serum glucose 263, BUN 27, creatinine 2.12, eGFR 29, potassium 3.1  10/12/2015: serum glucose 149, creatinine 2.0, eGFR 33, potassium 3.7, HbA1c 7.2%, total cholesterol 130, triglycerides 132, HDL 34, LDL 70, hepatic function normal, TSH 1.030, Vitamin D 47.2  08/16/2015: Serum glucose 179 mg, BUN 21, serum creatinine 1.53, eGFR 37 mL. Potassium 3.9. Compared to 07/12/2015, serum creatinine 1.72, eGFR 32 mL. Serum glucose 212. Labs are stable.  06/29/2015: BUN 50, creatinine 2.48, eGFR 24, potassium 4.8, H&H normal with microcytic indices, CBC otherwise PTT 10.5, INR 1.0  04/12/2015: total cholesterol 281, triglycerides 142, HDL 38, LDL 215, TSH 1.810, HbA1c 7.7%, serum glucose 162, BUN 19,  creatinine 1.1, potassium 4.0  Morbid obesity with BMI of 45.0-49.9, adult (E66.01)  Current Plans Patient here for follow-up of carotid artery stenosis and hypertension. She was referred to be evaluated for carotid endarterectomy, she was scheduled for CT angiogram of carotids which was canceled due to elevated serum creatinine.  Patient had bilateral renal artery stenosis which had stented in September 2016, her serum creatinine had in fact reduced and had stabilized at 1.5. Renal function has stabilized and she is now on Chlorthalidone. Added K-Dur 20 mEq daily. Due to abnormal renal duplex have recommended repeat renal angiogram with limited contrast and use of CO2. She is aware of acure renal failure and need for dialysis with the procedure. Office visit after the tests. D/C Clonidine 0.2 mg tablets and start Clonidine TTS patch at 0.3 mg q weekly for uncontrolled hypertension.  CC; Dr. Hassan Buckler.  Signed by Julie Page, MD (11/14/2015 5:45 PM)  I have discussed with Dr. Annamarie Major who recommends that I also proceed with cerebral angiogram to evaluate carotid stenosis as he feels that the stenosis is very high and may not  be feasible for endarterectomy.  We'll try to perform this at the same setting as renal angiogram.  I have discussed personally with the patient regarding the risks associated with cerebral angiogram including but not limited to less than 1% risk of stroke and she is agreeable to proceed.

## 2015-12-15 ENCOUNTER — Observation Stay (HOSPITAL_COMMUNITY)
Admission: RE | Admit: 2015-12-15 | Discharge: 2015-12-16 | Disposition: A | Discharge status: Home / Self Care | Payer: BC Managed Care – PPO | Source: Ambulatory Visit | Attending: Cardiology | Admitting: Cardiology

## 2015-12-15 ENCOUNTER — Encounter (HOSPITAL_COMMUNITY): Payer: Self-pay

## 2015-12-15 DIAGNOSIS — I6529 Occlusion and stenosis of unspecified carotid artery: Secondary | ICD-10-CM | POA: Diagnosis present

## 2015-12-15 DIAGNOSIS — I701 Atherosclerosis of renal artery: Secondary | ICD-10-CM | POA: Insufficient documentation

## 2015-12-15 DIAGNOSIS — Y831 Surgical operation with implant of artificial internal device as the cause of abnormal reaction of the patient, or of later complication, without mention of misadventure at the time of the procedure: Secondary | ICD-10-CM | POA: Insufficient documentation

## 2015-12-15 DIAGNOSIS — Z6841 Body Mass Index (BMI) 40.0 and over, adult: Secondary | ICD-10-CM | POA: Diagnosis not present

## 2015-12-15 DIAGNOSIS — I150 Renovascular hypertension: Secondary | ICD-10-CM | POA: Insufficient documentation

## 2015-12-15 DIAGNOSIS — Z7982 Long term (current) use of aspirin: Secondary | ICD-10-CM | POA: Insufficient documentation

## 2015-12-15 DIAGNOSIS — I6522 Occlusion and stenosis of left carotid artery: Secondary | ICD-10-CM | POA: Diagnosis not present

## 2015-12-15 DIAGNOSIS — N184 Chronic kidney disease, stage 4 (severe): Secondary | ICD-10-CM | POA: Insufficient documentation

## 2015-12-15 DIAGNOSIS — E7800 Pure hypercholesterolemia, unspecified: Secondary | ICD-10-CM | POA: Diagnosis not present

## 2015-12-15 DIAGNOSIS — Z794 Long term (current) use of insulin: Secondary | ICD-10-CM | POA: Insufficient documentation

## 2015-12-15 DIAGNOSIS — Z7902 Long term (current) use of antithrombotics/antiplatelets: Secondary | ICD-10-CM | POA: Diagnosis not present

## 2015-12-15 DIAGNOSIS — E039 Hypothyroidism, unspecified: Secondary | ICD-10-CM | POA: Insufficient documentation

## 2015-12-15 DIAGNOSIS — E1165 Type 2 diabetes mellitus with hyperglycemia: Secondary | ICD-10-CM | POA: Insufficient documentation

## 2015-12-15 DIAGNOSIS — Z79899 Other long term (current) drug therapy: Secondary | ICD-10-CM | POA: Insufficient documentation

## 2015-12-15 DIAGNOSIS — T82856A Stenosis of peripheral vascular stent, initial encounter: Principal | ICD-10-CM | POA: Insufficient documentation

## 2015-12-15 DIAGNOSIS — E1122 Type 2 diabetes mellitus with diabetic chronic kidney disease: Secondary | ICD-10-CM | POA: Insufficient documentation

## 2015-12-15 HISTORY — DX: Hypothyroidism, unspecified: E03.9

## 2015-12-15 LAB — GLUCOSE, CAPILLARY: Glucose-Capillary: 320 mg/dL — ABNORMAL HIGH (ref 65–99)

## 2015-12-15 LAB — BASIC METABOLIC PANEL
Anion gap: 14 (ref 5–15)
BUN: 24 mg/dL — AB (ref 6–20)
CALCIUM: 9.8 mg/dL (ref 8.9–10.3)
CHLORIDE: 96 mmol/L — AB (ref 101–111)
CO2: 26 mmol/L (ref 22–32)
CREATININE: 2.31 mg/dL — AB (ref 0.44–1.00)
GFR, EST AFRICAN AMERICAN: 25 mL/min — AB (ref >60)
GFR, EST NON AFRICAN AMERICAN: 22 mL/min — AB (ref >60)
Glucose, Bld: 351 mg/dL — ABNORMAL HIGH (ref 65–99)
Potassium: 2.9 mmol/L — ABNORMAL LOW (ref 3.5–5.1)
SODIUM: 136 mmol/L (ref 135–145)

## 2015-12-15 MED ORDER — LATANOPROST 0.005 % OP SOLN
1.0000 [drp] | Freq: Every day | OPHTHALMIC | Status: DC
  Administered 2015-12-15: 1 [drp] via OPHTHALMIC
  Filled 2015-12-15: qty 2.5

## 2015-12-15 MED ORDER — SODIUM CHLORIDE 0.9 % IV SOLN
INTRAVENOUS | Status: DC
  Administered 2015-12-15 – 2015-12-16 (×2): 150 mL/h via INTRAVENOUS

## 2015-12-15 MED ORDER — INSULIN ASPART PROT & ASPART (70-30 MIX) 100 UNIT/ML ~~LOC~~ SUSP
10.0000 [IU] | Freq: Every day | SUBCUTANEOUS | Status: DC
  Administered 2015-12-15: 10 [IU] via SUBCUTANEOUS
  Filled 2015-12-15: qty 10

## 2015-12-15 MED ORDER — ROSUVASTATIN CALCIUM 10 MG PO TABS
40.0000 mg | ORAL_TABLET | Freq: Every day | ORAL | Status: DC
  Administered 2015-12-15: 40 mg via ORAL
  Filled 2015-12-15: qty 4

## 2015-12-15 MED ORDER — ONDANSETRON HCL 4 MG/2ML IJ SOLN: 4.0000 mg | Freq: Four times a day (QID) | INTRAMUSCULAR | Status: DC | PRN

## 2015-12-15 MED ORDER — ATENOLOL 25 MG PO TABS
100.0000 mg | ORAL_TABLET | Freq: Two times a day (BID) | ORAL | Status: DC
  Administered 2015-12-15 – 2015-12-16 (×2): 100 mg via ORAL
  Filled 2015-12-15 (×2): qty 4

## 2015-12-15 MED ORDER — SODIUM CHLORIDE 0.9 % IV SOLN
250.0000 mL | INTRAVENOUS | Status: DC | PRN
Start: 2015-12-15 — End: 2015-12-16

## 2015-12-15 MED ORDER — ASPIRIN 81 MG PO CHEW: 81.0000 mg | CHEWABLE_TABLET | Freq: Every day | ORAL | Status: DC

## 2015-12-15 MED ORDER — SODIUM CHLORIDE 0.9% FLUSH: 3.0000 mL | INTRAVENOUS | Status: DC | PRN

## 2015-12-15 MED ORDER — INSULIN ASPART PROT & ASPART (70-30 MIX) 100 UNIT/ML PEN: 10.0000 [IU] | PEN_INJECTOR | Freq: Two times a day (BID) | SUBCUTANEOUS | Status: DC

## 2015-12-15 MED ORDER — MAGNESIUM OXIDE 400 (241.3 MG) MG PO TABS
200.0000 mg | ORAL_TABLET | Freq: Every day | ORAL | Status: DC
  Administered 2015-12-16: 200 mg via ORAL
  Filled 2015-12-15: qty 1

## 2015-12-15 MED ORDER — INSULIN ASPART PROT & ASPART (70-30 MIX) 100 UNIT/ML ~~LOC~~ SUSP
20.0000 [IU] | Freq: Every day | SUBCUTANEOUS | Status: DC
  Filled 2015-12-15: qty 10

## 2015-12-15 MED ORDER — SODIUM CHLORIDE 0.9 % IV SOLN: INTRAVENOUS | Status: DC

## 2015-12-15 MED ORDER — ASPIRIN 81 MG PO CHEW
81.0000 mg | CHEWABLE_TABLET | ORAL | Status: AC
  Administered 2015-12-16: 81 mg via ORAL
  Filled 2015-12-15: qty 1

## 2015-12-15 MED ORDER — SODIUM CHLORIDE 0.9% FLUSH
3.0000 mL | Freq: Two times a day (BID) | INTRAVENOUS | Status: DC
  Administered 2015-12-15: 3 mL via INTRAVENOUS

## 2015-12-15 MED ORDER — INSULIN ASPART 100 UNIT/ML ~~LOC~~ SOLN
0.0000 [IU] | Freq: Three times a day (TID) | SUBCUTANEOUS | Status: DC
  Administered 2015-12-16: 8 [IU] via SUBCUTANEOUS

## 2015-12-15 MED ORDER — AMLODIPINE BESYLATE 10 MG PO TABS
10.0000 mg | ORAL_TABLET | Freq: Every day | ORAL | Status: DC
  Administered 2015-12-16: 10 mg via ORAL
  Filled 2015-12-15: qty 1

## 2015-12-15 MED ORDER — LEVOTHYROXINE SODIUM 25 MCG PO TABS
137.0000 ug | ORAL_TABLET | Freq: Every day | ORAL | Status: DC
  Administered 2015-12-16: 137 ug via ORAL
  Filled 2015-12-15: qty 1

## 2015-12-15 MED ORDER — ACETAMINOPHEN 325 MG PO TABS: 650.0000 mg | ORAL_TABLET | ORAL | Status: DC | PRN

## 2015-12-15 MED ORDER — ROSUVASTATIN CALCIUM 10 MG PO TABS: 40.0000 mg | ORAL_TABLET | Freq: Every day | ORAL | Status: DC

## 2015-12-15 MED ORDER — MAGNESIUM 200 MG PO TABS
250.0000 mg | ORAL_TABLET | Freq: Every day | ORAL | Status: DC | PRN
  Filled 2015-12-15: qty 2

## 2015-12-15 MED ORDER — CLONIDINE HCL 0.3 MG/24HR TD PTWK
0.3000 mg | MEDICATED_PATCH | TRANSDERMAL | Status: DC
  Filled 2015-12-15: qty 1

## 2015-12-15 MED ORDER — CLOPIDOGREL BISULFATE 75 MG PO TABS
75.0000 mg | ORAL_TABLET | Freq: Every day | ORAL | Status: DC
  Administered 2015-12-16: 75 mg via ORAL

## 2015-12-15 MED ORDER — VITAMIN D (ERGOCALCIFEROL) 1.25 MG (50000 UNIT) PO CAPS: 50000.0000 [IU] | ORAL_CAPSULE | ORAL | Status: DC

## 2015-12-16 ENCOUNTER — Encounter (HOSPITAL_COMMUNITY): Payer: Self-pay | Admitting: Cardiology

## 2015-12-16 ENCOUNTER — Ambulatory Visit (HOSPITAL_COMMUNITY): Admission: RE | Admit: 2015-12-16 | Payer: BC Managed Care – PPO | Source: Ambulatory Visit | Admitting: Cardiology

## 2015-12-16 ENCOUNTER — Encounter (HOSPITAL_COMMUNITY)
Admission: RE | Disposition: A | Discharge status: Home / Self Care | Payer: Self-pay | Source: Ambulatory Visit | Attending: Cardiology

## 2015-12-16 DIAGNOSIS — T82856A Stenosis of peripheral vascular stent, initial encounter: Secondary | ICD-10-CM | POA: Diagnosis not present

## 2015-12-16 HISTORY — PX: PERIPHERAL VASCULAR CATHETERIZATION: SHX172C

## 2015-12-16 LAB — POCT ACTIVATED CLOTTING TIME: ACTIVATED CLOTTING TIME: 255 s

## 2015-12-16 LAB — BASIC METABOLIC PANEL
ANION GAP: 10 (ref 5–15)
BUN: 21 mg/dL — AB (ref 6–20)
CO2: 29 mmol/L (ref 22–32)
Calcium: 9.4 mg/dL (ref 8.9–10.3)
Chloride: 101 mmol/L (ref 101–111)
Creatinine, Ser: 2.18 mg/dL — ABNORMAL HIGH (ref 0.44–1.00)
GFR calc Af Amer: 27 mL/min — ABNORMAL LOW (ref >60)
GFR calc non Af Amer: 24 mL/min — ABNORMAL LOW (ref >60)
GLUCOSE: 247 mg/dL — AB (ref 65–99)
POTASSIUM: 2.8 mmol/L — AB (ref 3.5–5.1)
Sodium: 140 mmol/L (ref 135–145)

## 2015-12-16 LAB — CBC
HEMATOCRIT: 37.1 % (ref 36.0–46.0)
HEMOGLOBIN: 11.9 g/dL — AB (ref 12.0–15.0)
MCH: 24 pg — AB (ref 26.0–34.0)
MCHC: 32.1 g/dL (ref 30.0–36.0)
MCV: 74.8 fL — AB (ref 78.0–100.0)
PLATELETS: 138 10*3/uL — AB (ref 150–400)
RBC: 4.96 MIL/uL (ref 3.87–5.11)
RDW: 15.8 % — ABNORMAL HIGH (ref 11.5–15.5)
WBC: 6.1 10*3/uL (ref 4.0–10.5)

## 2015-12-16 LAB — PROTIME-INR
INR: 1.13 (ref 0.00–1.49)
Prothrombin Time: 14.7 seconds (ref 11.6–15.2)

## 2015-12-16 LAB — GLUCOSE, CAPILLARY: GLUCOSE-CAPILLARY: 259 mg/dL — AB (ref 65–99)

## 2015-12-16 SURGERY — RENAL ANGIOGRAPHY

## 2015-12-16 MED ORDER — CEFAZOLIN SODIUM 1-5 GM-% IV SOLN
INTRAVENOUS | Status: AC
  Filled 2015-12-16: qty 50

## 2015-12-16 MED ORDER — FENTANYL CITRATE (PF) 100 MCG/2ML IJ SOLN
INTRAMUSCULAR | Status: DC | PRN
  Administered 2015-12-16: 25 ug via INTRAVENOUS

## 2015-12-16 MED ORDER — CLOPIDOGREL BISULFATE 75 MG PO TABS
ORAL_TABLET | ORAL | Status: AC
  Filled 2015-12-16: qty 1

## 2015-12-16 MED ORDER — HEPARIN SODIUM (PORCINE) 1000 UNIT/ML IJ SOLN
INTRAMUSCULAR | Status: DC | PRN
  Administered 2015-12-16: 2000 [IU] via INTRAVENOUS
  Administered 2015-12-16: 8000 [IU] via INTRAVENOUS

## 2015-12-16 MED ORDER — MIDAZOLAM HCL 2 MG/2ML IJ SOLN
INTRAMUSCULAR | Status: AC
  Filled 2015-12-16: qty 2

## 2015-12-16 MED ORDER — POTASSIUM CHLORIDE CRYS ER 20 MEQ PO TBCR
20.0000 meq | EXTENDED_RELEASE_TABLET | Freq: Two times a day (BID) | ORAL | Status: DC
  Administered 2015-12-16: 20 meq via ORAL
  Filled 2015-12-16: qty 1

## 2015-12-16 MED ORDER — POTASSIUM CHLORIDE CRYS ER 20 MEQ PO TBCR
40.0000 meq | EXTENDED_RELEASE_TABLET | Freq: Once | ORAL | Status: AC
  Administered 2015-12-16: 40 meq via ORAL
  Filled 2015-12-16: qty 2

## 2015-12-16 MED ORDER — MIDAZOLAM HCL 2 MG/2ML IJ SOLN
INTRAMUSCULAR | Status: DC | PRN
  Administered 2015-12-16: 2 mg via INTRAVENOUS

## 2015-12-16 MED ORDER — IODIXANOL 320 MG/ML IV SOLN
INTRAVENOUS | Status: DC | PRN
  Administered 2015-12-16: 10 mL via INTRA_ARTERIAL

## 2015-12-16 MED ORDER — FENTANYL CITRATE (PF) 100 MCG/2ML IJ SOLN
INTRAMUSCULAR | Status: AC
  Filled 2015-12-16: qty 2

## 2015-12-16 MED ORDER — LIDOCAINE HCL (PF) 1 % IJ SOLN
INTRAMUSCULAR | Status: DC | PRN
  Administered 2015-12-16: 20 mL via INTRADERMAL

## 2015-12-16 MED ORDER — SODIUM CHLORIDE 0.9 % IV SOLN
1.0000 mL/kg/h | INTRAVENOUS | Status: AC
  Administered 2015-12-16: 1 mL/kg/h via INTRAVENOUS

## 2015-12-16 MED ORDER — HEPARIN (PORCINE) IN NACL 2-0.9 UNIT/ML-% IJ SOLN
INTRAMUSCULAR | Status: AC
  Filled 2015-12-16: qty 1000

## 2015-12-16 MED ORDER — HEPARIN SODIUM (PORCINE) 1000 UNIT/ML IJ SOLN
INTRAMUSCULAR | Status: AC
  Filled 2015-12-16: qty 1

## 2015-12-16 MED ORDER — LIDOCAINE-EPINEPHRINE 1 %-1:100000 IJ SOLN
INTRAMUSCULAR | Status: DC | PRN
  Administered 2015-12-16: 3 mL via INTRADERMAL

## 2015-12-16 MED ORDER — LIDOCAINE HCL (PF) 1 % IJ SOLN
INTRAMUSCULAR | Status: AC
  Filled 2015-12-16: qty 30

## 2015-12-16 MED ORDER — CEFAZOLIN SODIUM 1-5 GM-% IV SOLN
INTRAVENOUS | Status: DC | PRN
  Administered 2015-12-16: 1 g via INTRAVENOUS

## 2015-12-16 SURGICAL SUPPLY — 23 items
BALLN ANGIOSCULPT OTW 6X20 (BALLOONS) ×4
BALLOON ANGIOSCULPT OTW 6X20 (BALLOONS) IMPLANT
CATH ANGIO 5F BER 100CM (CATHETERS) ×1 IMPLANT
CATH SOFT-VU 4F 65 STRAIGHT (CATHETERS) IMPLANT
CATH SOFT-VU ST 4F 90CM (CATHETERS) ×1 IMPLANT
CATH SOFT-VU STRAIGHT 4F 65CM (CATHETERS) ×4
DEVICE CLOSURE PERCLS PRGLD 6F (VASCULAR PRODUCTS) IMPLANT
FILTER CO2 0.2 MICRON (VASCULAR PRODUCTS) ×1 IMPLANT
GUIDE CATH VISTA IMA 6F (CATHETERS) ×1 IMPLANT
HOVERMATT SINGLE USE (MISCELLANEOUS) ×1 IMPLANT
KIT ENCORE 26 ADVANTAGE (KITS) ×1 IMPLANT
KIT MICROINTRODUCER STIFF 5F (SHEATH) ×1 IMPLANT
KIT PV (KITS) ×4 IMPLANT
PERCLOSE PROGLIDE 6F (VASCULAR PRODUCTS) ×4
RESERVOIR CO2 (VASCULAR PRODUCTS) ×1 IMPLANT
SET FLUSH CO2 (MISCELLANEOUS) ×1 IMPLANT
SHEATH PINNACLE 6F 10CM (SHEATH) ×1 IMPLANT
TRANSDUCER W/STOPCOCK (MISCELLANEOUS) ×4 IMPLANT
TRAY PV CATH (CUSTOM PROCEDURE TRAY) ×4 IMPLANT
WIRE HITORQ VERSACORE ST 145CM (WIRE) ×1 IMPLANT
WIRE SPARTACORE .014X300CM (WIRE) ×1 IMPLANT
WIRE STABILIZER XS .014X180CM (WIRE) ×1 IMPLANT
WIRE TORQFLEX AUST .018X40CM (WIRE) ×1 IMPLANT

## 2015-12-16 NOTE — Interval H&P Note (Signed)
History and Physical Interval Note:  12/16/2015 6:28 AM  Julie [name]  has presented today for surgery, with the diagnosis of renal vascular hp  The various methods of treatment have been discussed with the patient and family. After consideration of risks, benefits and other options for treatment, the patient has consented to  Procedure(s): Renal Angiography (N/A) Carotid Angiography/Bilateral (N/A) and possible renal angioplasty as a surgical intervention .  The patient's history has been reviewed, patient examined, no change in status, stable for surgery.  I have reviewed the patient's chart and labs.  Questions were answered to the patient's satisfaction.     Adrian Prows

## 2015-12-16 NOTE — Discharge Instructions (Signed)
Renal Artery Stenosis Renal artery stenosis (RAS) is narrowing of the artery that carries blood to your kidneys. It can affect one or both kidneys. Your kidneys filter waste and extra fluid from your blood. You get rid of the waste and fluid when you urinate. Your kidneys also make an important chemical messenger (hormone) called renin. Renin helps regulate your blood pressure. The first sign of RAS may be high blood pressure. Over time, other symptoms can develop. CAUSES  Plaque buildup in your arteries (atherosclerosis) is the main cause of RAS. The plaques that cause this are made up of:  Fat.  Cholesterol.  Calcium.  Other substances. As these substances build up in your renal artery, this slows the blood supply to your kidneys. The lack of blood and oxygen causes the signs and symptoms of RAS. A much less common cause of RAS is a disease called fibromuscular dysplasia. This disease causes abnormal cell growth that narrows the renal artery. It is not related to atherosclerosis. It occurs mostly in women who are 52-3 years old. It may be passed down through families. RISK FACTORS  You may be at risk for renal artery stenosis if you:  Are a man who is at least 59 years old.  Are a woman who is at least 59 years old.  Have high blood pressure.  Have high cholesterol.  Are a smoker.  Abuse alcohol.  Have diabetes or prediabetes.  Are overweight.  Have a family history of early heart disease. SIGNS AND SYMPTOMS  RAS usually develops slowly. You may not have any signs or symptoms at first. The earliest signs may be:  Developing high blood pressure.  A sudden increase in existing high blood pressure.  No longer responding to medicine that used to control your blood pressure. Later signs and symptoms are due to kidney damage. They may include:  Fatigue.  Shortness of breath.  Swollen legs and feet.  Dry skin.  Headaches.  Muscle cramps.  Loss of  appetite.  Nausea or vomiting. DIAGNOSIS  Your health care provider may suspect RAS based on changes in your blood pressure and your risk factors. A physical exam will be done. Your health care provider may use a stethoscope to listen for a whooshing sound (bruit) that can occur where the renal artery is blocking blood flow. Several tests may be done to confirm a diagnosis of RAS. These may include:  Blood and urine tests to check your kidney function.  Imaging tests of your kidneys, such as:  A test that involves using sound waves to create an image of your kidneys and the blood flow to your kidneys (ultrasound).  A test in which dye is injected into one of your blood vessels so images can be taken as the dye flows through your renal arteries (angiogram). These tests can be done using X-rays, a CT scan (computed tomography angiogram, CTA), or a type of MRI (magnetic resonance angiogram, MRA). TREATMENT  Making lifestyle changes to reduce your risk factors is the first treatment option for early RAS. If the blood flow to one of your kidneys is cut by more than half, you may need medicine to:  Lower your blood pressure. This is the main medical treatment for RAS. You may need more than one type of medicine for this. The two types that work best for RAS are:  ACE inhibitors.  Angiotensin receptor blockers.  Reduce fluid in the body (diuretics).  Lower your cholesterol (statins). If medicine is not enough  to control RAS, you may need surgery. This may involve:  Threading a tube with an inflatable balloon into the renal artery to force it open (angioplasty).  Removing plaque from inside the artery (endarterectomy). HOME CARE INSTRUCTIONS  Take medicines only as directed by your health care provider.  Make any lifestyle changes recommended by your health care provider. This may include:  Working with a dietitian to maintain a heart-healthy diet. This type of diet is low in saturated  fat, salt, and added sugar.  Starting an exercise program as directed by your health care provider.  Maintaining a healthy weight.  Quitting smoking.  Not abusing alcohol.  Keep all follow-up visits as directed by your health care provider. This is important. SEEK MEDICAL CARE IF:  Your symptoms of RAS are not getting better.  Your symptoms are changing or getting worse. SEEK IMMEDIATE MEDICAL CARE IF:  You have very bad pain in your back or abdomen.  You have blood in your urine.   This information is not intended to replace advice given to you by your health care provider. Make sure you discuss any questions you have with your health care provider.   Document Released: 07/11/2005 Document Revised: 11/05/2014 Document Reviewed: 01/28/2014 Elsevier Interactive Patient Education [employer].

## 2015-12-16 NOTE — Discharge Summary (Signed)
Physician Discharge Summary  Patient ID: Julie [name] MRN: [id number] DOB/AGE: 05-29-1956 60 y.o.  Admit date: 12/15/2015 Discharge date: 12/16/2015  Primary Discharge Diagnosis Renovascular hypertension with bilateral renal artery stenosis Asymptomatic high-grade left carotid artery stenosis of greater than 80%  Secondary Discharge Diagnosis Morbid obesity Diabetes mellitus type 2 uncontrolled Hypertension Hyperlipidemia  Significant Diagnostic Studies:  12/16/2015: Procedure performed: left carotid selective arteriogram, selective right and left renal arteriogram followed by angioplasty for instent restenosis. A total of 10 cc of contrast was utilized for the entire procedure.  1. Renal artery angiogram and scoring balloon angioplasty for in-stent restenosis of previously placed stents on 07/01/2015, 6x55mm Cordis Blue stents. Stenosis reduced from 90% to 0%. with 6.0 x 20 mm scoring balloon angioplasty.  2. Left distal common carotid artery just prior to bifurcation high-grade greater than 80% stenosis.Marland Kitchen  Hospital Course: Patient with uncontrolled diabetes mellitus, difficult to control hypertension, acute worsening renal function that started him on month ago, who has history of prior bilateral renal artery stenting on 07/05/2015 and has 6 x 15 mm balloon expandable cordis blue stents.  Outpatient renal duplex had revealed suggestion of restenosis.  Due to worsening renal failure, markedly elevated blood pressure in spite of aggressive medical therapy, it is felt that she should be admitted to the hospital for hydration and study her renal arteries the following day and if restenosis confirmed to proceed with angioplasty.  Patient also has known carotid artery stenosis by vascular Doppler, she was scheduled for CT angiogram of her neck however due to renal insufficiency had to be postponed.  After discussions with Dr. Trula Slade it is felt that during renal angiogram, maintaining and keeping  contrast load to the minimum possible, to selectively perform left carotid arteriogram to confirm severity of stenosis and also the anatomy as it was felt that the stenosis was very high.  She underwent cerebral angiogram on 12/16/2015 conforming high-grade left common carotid stenosis.  CO2 renal angiography suggested bilateral renal artery stenosis, physiologic pressure pullback revealed high-grade probably greater than 90% stenosis of the right and left renal arteries with significant pressure gradients.  She underwent successful bilateral renal artery angioplasties for in-stent restenosis with a 6.0 x 20 mm Angiosculpt balloon, with no residual pressure gradient.  No contrast was utilized for renal angiogram except CO2 as she has stage 3-4 chronic kidney disease.  As the procedure was done early in the morning, patient's blood pressure improved dramatically immediately following renal angioplasty.  She had ablated in the hallway as her groin site was closed with Perclose and she was felt stable for discharge.  Recommendations on discharge:  I will hold off on using Ace/ARB for now, I'll repeat BMP sometime next week and see her back in the office and readjust her medication and manage her blood pressure.  Blood pressure improved from systolic greater than [id number] to around 130s postprocedure.  Discharge Exam: Blood pressure 120/62, pulse 57, temperature 98.1 F (36.7 C), temperature source Axillary, resp. rate 20, height 5\' 4"  (1.626 m), weight 121.564 kg (268 lb), SpO2 100 %.   General appearance: alert, cooperative, appears older than stated age and no distress Resp: clear to auscultation bilaterally Chest wall: no tenderness GI: soft, non-tender; bowel sounds normal; no masses, no organomegaly and obese with large pannus Extremities: extremities normal, atraumatic, no cyanosis or edema Pulses: Right groin site without significant tenderness or hematoma, there is a bruit heard in the right femoral  artery but without any pulsatile mass. Left femoral  artery very distant and feeble, bilateral popliteals could not be felt and absent pedal pulses bilaterally. Carotid pulse 2+ bilaterally without any bruit. Neurologic: Grossly normal Labs:   Lab Results  Component Value Date   WBC 6.1 12/16/2015   HGB 11.9* 12/16/2015   HCT 37.1 12/16/2015   MCV 74.8* 12/16/2015   PLT 138* 12/16/2015    Recent Labs Lab 12/16/15 0450  NA 140  K 2.8*  CL 101  CO2 29  BUN 21*  CREATININE 2.18*  CALCIUM 9.4  GLUCOSE 247*    HEMOGLOBIN A1C Lab Results  Component Value Date   HGBA1C 8.9* 07/04/2015   MPG 209 07/04/2015    FOLLOW UP PLANS AND APPOINTMENTS: Patient is appointment with me on 12/23/2015, I will repeat BMP the day.     Medication List    STOP taking these medications        chlorthalidone 25 MG tablet  Commonly known as:  HYGROTON     Olmesartan-Amlodipine-HCTZ 40-10-25 MG Tabs  Commonly known as:  TRIBENZOR     Potassium Chloride ER 20 MEQ Tbcr      TAKE these medications        amLODipine 10 MG tablet  Commonly known as:  NORVASC  Take 10 mg by mouth daily.     aspirin 81 MG chewable tablet  Chew 1 tablet (81 mg total) by mouth daily.     atenolol 100 MG tablet  Commonly known as:  TENORMIN  Take 100 mg by mouth 2 (two) times daily.     cloNIDine 0.3 mg/24hr patch  Commonly known as:  CATAPRES - Dosed in mg/24 hr  Place 1 patch onto the skin once a week.     clopidogrel 75 MG tablet  Commonly known as:  PLAVIX  Take 1 tablet (75 mg total) by mouth daily with breakfast.     ergocalciferol 50000 units capsule  Commonly known as:  VITAMIN D2  Take 50,000 Units by mouth every Monday.     levothyroxine 137 MCG tablet  Commonly known as:  SYNTHROID, LEVOTHROID  Take 137 mcg by mouth daily.     Magnesium 250 MG Tabs  Take 250 mg by mouth daily as needed (for supplementation).     NOVOLOG MIX 70/30 FLEXPEN (70-30) 100 UNIT/ML FlexPen  Generic drug:   insulin aspart protamine - aspart  10-20 Units. 20 units every morning and 10 units before the evening meal     rosuvastatin 40 MG tablet  Commonly known as:  CRESTOR  Take 40 mg by mouth daily.     ZIOPTAN 0.0015 % Soln  Generic drug:  Tafluprost  Place 1 drop into both eyes every evening.          Adrian Prows, MD 12/16/2015, 4:23 PM  Pager: [phone] Office: [phone] If no answer: [phone]

## 2015-12-16 NOTE — H&P (View-Only) (Signed)
OFFICE VISIT NOTES COPIED TO EPIC FOR DOCUMENTATION  . Julie [name] 12-03-15 8:19 AM Location: [facility] Patient #: [phone] DOB: 12/07/1955 Married / Language: English / Race: Black or African American Female   History of Present Illness Julie Page MD; 12-03-2015 5:39 PM) Patient words: Last o/v 10-27-2015; f/u for HTN, renal failure;Marland Kitchen  The patient is a 60 year old female who presents for a Follow-up for Hypertension. She has morbid obesity, difficult to control hypertension, uncontrolled diabetes mellitus, hypothyroidism, and carotid artery stenosis. Due to difficulty in controlling blood pressure, she underwent renal angiography on 07/02/2015 and was found to have bilateral high-grade renal artery stenosis of 90% for which he underwent balloon angioplasty and stenting with 6 x 15 mm Cordis blue stents.   Renal function had significantly decreased after starting spironolactone and hence discontinued. However due to continued renal insufficiency Tribenzor(on it chronically) was discontinued and serum creatinine improved. Tribenzor was restarted at a lower dose and renal function had remained stable, however due to recent worsening in renal function, ARB has been discontinued.  Underwent renal duplex and present for f/u.   Problem List/Past Medical Julie [name]; 12-03-2015 1:31 PM) Renovascular hypertension (I15.0) Renal artery angiogram 07/02/2015: Bilateral RAS of 80-90% to 0% with 6x57m Cordis Blue stents. Hypercholesteremia (E78.00) Uncontrolled type 2 diabetes mellitus without complication, with long-term current use of insulin (E11.65) Shortness of breath on exertion (R06.02) Echo- 05/31/2015 1. Left ventricle cavity is normal in size. Moderate concentric hypertrophy of the left ventricle. Normal global wall motion. Calculated EF 68%. 2. Mild mitral regurgitation. 3. Mild tricuspid regurgitation. No evidence of pulmonary hypertension. Lexiscan myoview  stress test 05/20/2015: 1. The resting electrocardiogram demonstrated normal sinus rhythm, incomplete RBBB and no resting arrhythmias. Stress EKG is non-diagnostic for ischemia as it a pharmacologic stress using Lexiscan. 2. Myocardial perfusion imaging is normal. Overall left ventricular systolic function was normal without regional wall motion abnormalities. The left ventricular ejection fraction was 64%. Morbid obesity with BMI of 45.0-49.9, adult (E66.01) Family history of premature CAD (Z82.49) Both parents died at age 88920from CAD Abnormal EKG (R94.31) Asymptomatic bilateral carotid artery stenosis ((N27.78 Vascular office visit 10/03/2015: Evaluation for carotid artery stenosis. Scheduled for CTA of the neck to determine extent of carotid artery stenosis and location left carotid bifurcation. Follow up after CT. Carotid artery duplex 06/01/2015: Moderate stenosis in the right internal carotid artery (50-69%). Mild stenosis in the right common carotid artery (<50%). Moderate stenosis in the left external carotid artery (>50%). Severe stenosis in the left internal carotid artery (>70%). Severe stenosis in the left common carotid artery (>50%) probably in the range > 80-99%. Heavy soft plaque burden throughout the bilateral carotid arteries. Consider further work-up. Follow-up in 6 months if clinically indicated. Pruritus (L29.9) Sciatica (M54.30) Hypothyroidism, adult (E03.9)  Allergies (Julie MaltaSergeant; 12017-02-041:31 PM) Toprol XL *BETA BLOCKERS* Glimepiride *ANTIDIABETICS* Sulfa Drugs  Family History (Julie MaltaSergeant; 1Feb 04, 20171:31 PM) Mother Deceased. at age 78932from MI; no strokes or MIs prior to this event; HTN but no other cardiovascular conditions Father Deceased. at age 78975from MI; no strokes or MIs prior to this event; no other cardiovascular conditions Sister 1 In good health. 5 yrs older; no cardiovascular conditions  Social History (Julie MaltaSergeant; 102-04-17 1:31 PM) Current tobacco use Never smoker. Non Drinker/No Alcohol Use Marital status Married. Living Situation Lives with spouse. Number of Children 2.  Past Surgical History (Julie MaltaSergeant; 1February 04, 20171:31 PM) None07/04/2015  Medication History (Julie MaltaSergeant; 12017/02/04  1:39 PM) AmLODIPine Besylate (10MG Tablet, 1 (one) Tablet Oral daily, Taken starting 10/27/2015) Active. (Discontinue Tribenzor- renal failure) Atenolol (100MG Tablet, 1 Tablet Oral twice a day, Taken starting 10/27/2015) Active. (Discontinue Tribenzor- renal failure) Chlorthalidone (25MG Tablet, 1 (one) Tablet Oral every morning, Taken starting 10/27/2015) Active. (Discontinue Tribenzor- renal failure) HydrOXYzine HCl (50MG Tablet, 1 (one) Tablet Tablet Tablet Oral at bedtime as needed, Taken starting 08/15/2015) Active. Crestor (40MG Tablet, 1 (one) Tablet Tablet Tablet T Oral daily, Taken starting 06/10/2015) Active. Aspirin Adult Low Dose (81MG Tablet DR, 1 (one) Tablet DR Tablet DR Ta Oral daily, Taken starting 05/05/2015) Active. CloNIDine HCl (0.2MG Tablet, 1 Oral two times daily) Active. Levothyroxine Sodium (137MCG Tablet, 1 Oral daily) Active. NovoLOG Mix 70/30 FlexPen ((70-30) 100UNIT/ML Susp Pen-inj, inject 20 units in morning Subcutaneous 10 units before dinner) Active. Zioptan (0.0015% Solution, 1 drop each eye Ophthalmic daily) Active. Clopidogrel Bisulfate (75MG Tablet, 1 Oral daily) Active. Medications Reconciled (verbally)  Diagnostic Studies History (Julie [name]; 11/14/2015 1:31 PM) Labwork 11/10/2015: Serum glucose 263, BUN 27, creatinine 2.12, eGFR 29, potassium 3.1 10/12/2015: serum glucose 149, creatinine 2.0, eGFR 33, potassium 3.7, HbA1c 7.2%, total cholesterol 130, triglycerides 132, HDL 34, LDL 70, hepatic function normal, TSH 1.030, Vitamin D 47.2 08/16/2015: Serum glucose 179 mg, BUN 21, serum creatinine 1.53, eGFR 37 mL. Potassium 3.9. Compared to 07/12/2015, serum  creatinine 1.72, eGFR 32 mL. Serum glucose 212. Labs are stable. 06/29/2015: BUN 50, creatinine 2.48, eGFR 24, potassium 4.8, H&H normal with microcytic indices, CBC otherwise PTT 10.5, INR 1.0 04/12/2015: total cholesterol 281, triglycerides 142, HDL 38, LDL 215, TSH 1.810, HbA1c 7.7%, serum glucose 162, BUN 19, creatinine 1.1, potassium 4.0 Echocardiogram2006 Treadmill stress test2006 Nuclear stress test07/22/2016 1. The resting electrocardiogram demonstrated normal sinus rhythm, incomplete RBBB and no resting arrhythmias. Stress EKG is non-diagnostic for ischemia as it a pharmacologic stress using Lexiscan. 2. Myocardial perfusion imaging is normal. Overall left ventricular systolic function was normal without regional wall motion abnormalities. The left ventricular ejection fraction was 64%. Carotid Doppler08/12/2014 Moderate stenosis in the right internal carotid artery (50-69%). Mild stenosis in the right common carotid artery (<50%). Moderate stenosis in the left external carotid artery (>50%). Severe stenosis in the left internal carotid artery (>70%). Severe stenosis in the left common carotid artery (>50%) probably in the range > 80-99%. Heavy soft plaque burden throughout the bilateral carotid arteries. Consider further work-up. Follow-up in 6 months if clinically indicated. Renal Artery Angiogram09/12/2014 Bilateral RAS of 80-90% to 0% with 6x15mm Cordis Blue stents. CT Scan of Chest12/16/2016    Review of Systems (Jagadeesh R Irene Mitcham, MD; 11/14/2015 5:44 PM) General Not Present- Anorexia, Fatigue and Fever. Respiratory Present- Decreased Exercise Tolerance and Difficulty Breathing on Exertion. Not Present- Cough. Cardiovascular Present- Edema. Not Present- Chest Pain, Claudications, Orthopnea, Paroxysmal Nocturnal Dyspnea and Shortness of Breath. Gastrointestinal Not Present- Change in Bowel Habits, Constipation and Nausea. Neurological Not Present- Focal Neurological  Symptoms. Endocrine Not Present- Appetite Changes, Cold Intolerance and Heat Intolerance. Hematology Not Present- Anemia, Petechiae and Prolonged Bleeding.  Vitals (Jagadeesh R. Lilyana Lippman MD; 11/14/2015 2:11 PM) 11/14/2015 2:11 PM Weight: 272.38 lb Height: 64.5in Body Surface Area: 2.24 m Body Mass Index: 46.03 kg/m  BP: 180/70 (Sitting, Left Arm, Large)    11/14/2015 1:32 PM Weight: 272.38 lb Height: 64.5in Body Surface Area: 2.24 m Body Mass Index: 46.03 kg/m  Pulse: 63 (Regular)  P.OX: 97% (Room air) BP: 211/91 (Sitting, Left Arm, Large)       Physical Exam (  Julie Page, MD; 11/14/2015 5:44 PM) General Mental Status-Alert. General Appearance-Cooperative, Appears stated age, Not in acute distress. Orientation-Oriented X3. Build & Nutrition-Moderately built and Morbidly obese.  Head and Neck Thyroid Gland Characteristics - no palpable nodules, no palpable enlargement.  Chest and Lung Exam Palpation Tender - No chest wall tenderness.  Cardiovascular Cardiovascular examination reveals -normal heart sounds, regular rate and rhythm with no murmurs. Inspection Jugular vein - Right - No Distention.  Abdomen Inspection Contour - Obese and Pannus present. Palpation/Percussion Normal exam - Non Tender and No hepatosplenomegaly. Auscultation Normal exam - Bowel sounds normal.  Peripheral Vascular Lower Extremity Inspection - Left - No Pigmentation, No Varicose veins. Right - No Pigmentation, No Varicose veins. Palpation - Edema - Left - Trace edema. Right - Trace edema. Right - 2+, Bruit, 2+ and Bruit. Note: small "knot" at access site, no pain or tenderness. Popliteal pulse - Left - Normal. Right - Normal. Dorsalis pedis pulse - Left - Normal. Right - Normal. Posterior tibial pulse - Left - Normal. Right - Normal. Carotid arteries - Bilateral-Harsh Bruit and Carotid bruit. Abdomen-No prominent abdominal aortic pulsation, No epigastric  bruit.  Neurologic Motor-Grossly intact without any focal deficits.  Musculoskeletal Global Assessment Left Lower Extremity - normal range of motion without pain. Right Lower Extremity - normal range of motion without pain.  Assessment & Plan Julie Page MD; 11/14/2015 5:44 PM)  Renovascular hypertension (I15.0) Story: Renal artery angiogram 07/02/2015: Bilateral RAS of 80-90% to 0% with 6x77m Cordis Blue stents.  Renal artery duplex 11/08/2015: Hemodynamically significant stenosis of the right renal artery suggesting restenosis in previously stented renal artery. Left renal artery stent appears patent. Normal intrarenal vascular perfusion is noted in both kidneys. Diffuse plaque noted in the abdominal aorta.  Current Plans Started Potassium Chloride ER 20MEQ, 1 (one) Tablet every morning with chlorthalidone, #30, 11/14/2015, Ref. x2. Discontinued CloNIDine HCl 0.2MG (Will swiitch to TTS patch). Started CloNIDine HCl 0.3MG/24HR, 1 (one) Patch weekly, 4 Patch, 11/14/2015, Ref. x2. Local Order: Discontinue catapres tablets- efficacy Future Plans 21/01/4314 METABOLIC PANEL, BASIC (840086 - one time 12/07/2015: CBC & PLATELETS (AUTO) ((76195 - one time  Asymptomatic bilateral carotid artery stenosis ((K93.26 Story: Vascular office visit 10/03/2015: Evaluation for carotid artery stenosis. Scheduled for CTA of the neck to determine extent of carotid artery stenosis and location left carotid bifurcation. Follow up after CT.  Carotid artery duplex 06/01/2015: Moderate stenosis in the right internal carotid artery (50-69%). Mild stenosis in the right common carotid artery (<50%). Moderate stenosis in the left external carotid artery (>50%). Severe stenosis in the left internal carotid artery (>70%). Severe stenosis in the left common carotid artery (>50%) probably in the range > 80-99%. Heavy soft plaque burden throughout the bilateral carotid arteries. Consider further work-up.  Follow-up in 6 months if clinically indicated. Labwork Story: 11/10/2015: Serum glucose 263, BUN 27, creatinine 2.12, eGFR 29, potassium 3.1  10/12/2015: serum glucose 149, creatinine 2.0, eGFR 33, potassium 3.7, HbA1c 7.2%, total cholesterol 130, triglycerides 132, HDL 34, LDL 70, hepatic function normal, TSH 1.030, Vitamin D 47.2  08/16/2015: Serum glucose 179 mg, BUN 21, serum creatinine 1.53, eGFR 37 mL. Potassium 3.9. Compared to 07/12/2015, serum creatinine 1.72, eGFR 32 mL. Serum glucose 212. Labs are stable.  06/29/2015: BUN 50, creatinine 2.48, eGFR 24, potassium 4.8, H&H normal with microcytic indices, CBC otherwise PTT 10.5, INR 1.0  04/12/2015: total cholesterol 281, triglycerides 142, HDL 38, LDL 215, TSH 1.810, HbA1c 7.7%, serum glucose 162, BUN 19,  creatinine 1.1, potassium 4.0  Morbid obesity with BMI of 45.0-49.9, adult (E66.01)  Current Plans Patient here for follow-up of carotid artery stenosis and hypertension. She was referred to be evaluated for carotid endarterectomy, she was scheduled for CT angiogram of carotids which was canceled due to elevated serum creatinine.  Patient had bilateral renal artery stenosis which had stented in September 2016, her serum creatinine had in fact reduced and had stabilized at 1.5. Renal function has stabilized and she is now on Chlorthalidone. Added K-Dur 20 mEq daily. Due to abnormal renal duplex have recommended repeat renal angiogram with limited contrast and use of CO2. She is aware of acure renal failure and need for dialysis with the procedure. Office visit after the tests. D/C Clonidine 0.2 mg tablets and start Clonidine TTS patch at 0.3 mg q weekly for uncontrolled hypertension.  CC; Dr. Bindu Balan.  Signed by Jagadeesh R Keaghan Staton, MD (11/14/2015 5:45 PM)  I have discussed with Dr. Wells Brabham who recommends that I also proceed with cerebral angiogram to evaluate carotid stenosis as he feels that the stenosis is very high and may not  be feasible for endarterectomy.  We'll try to perform this at the same setting as renal angiogram.  I have discussed personally with the patient regarding the risks associated with cerebral angiogram including but not limited to less than 1% risk of stroke and she is agreeable to proceed.   

## 2015-12-19 MED FILL — Heparin Sodium (Porcine) 2 Unit/ML in Sodium Chloride 0.9%: INTRAMUSCULAR | Qty: 1000 | Status: AC

## 2015-12-20 LAB — HEMOGLOBIN A1C
HEMOGLOBIN A1C: 12.2 % — AB (ref 4.8–5.6)
MEAN PLASMA GLUCOSE: 303 mg/dL

## 2015-12-28 ENCOUNTER — Encounter (HOSPITAL_COMMUNITY): Payer: Self-pay | Admitting: Cardiology

## 2016-06-07 ENCOUNTER — Ambulatory Visit (INDEPENDENT_AMBULATORY_CARE_PROVIDER_SITE_OTHER): Payer: Commercial Managed Care - HMO | Admitting: Family Medicine

## 2016-06-07 ENCOUNTER — Encounter: Payer: Self-pay | Admitting: Family Medicine

## 2016-06-07 DIAGNOSIS — N183 Chronic kidney disease, stage 3 (moderate): Secondary | ICD-10-CM

## 2016-06-07 DIAGNOSIS — E1122 Type 2 diabetes mellitus with diabetic chronic kidney disease: Secondary | ICD-10-CM

## 2016-06-07 NOTE — Progress Notes (Signed)
Medical Nutrition Therapy:  Appt start time: [id number] end time:  1630. PCP: Julie Ada, MD; Julie [name] at [facility] ([place] fax all visit note to Dr. Tresa [name]: [phone])  Assessment:  Primary concerns today: Weight management and Blood sugar control.  Julie [name] said she needs a fresh start on her dietary efforts (last seen for MNT in Dec 2016).  During the first few months of the year, she had started a new job, and was also working 3 other part-time jobs at the time.  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  Knee problems have resolved, so are no longer limiting exercise, which she plans to restart consistently this week.  [facility]   Vision has not been testing BG levels regularly.  Had started getting nocturnal hypoglycemic events, so reduced humalog to current 35 in AM and 30 PM.    Learning Readiness: Ready; in a better position to realize real changes than she was last December.    Barriers to learning/adherence to lifestyle change: Not sure Julie [name] has good social support for these lifestyle changes recommended, i.e., no other family members are physically active.    Usual eating pattern includes 3 meals and 0-2 snacks per day. Frequent foods and beverages include ~50 oz water/day, ~12 oz lemonade.  Avoided foods include red meat, pickles (b/c of sodium), liver and pork (b/c of cholesterol).   Usual physical activity includes [employer] starting tomorrow:  Plans to get 150 min of cardio per week.      24-hr recall:  (Up at 7 AM) B (8 AM)-  2 c Apple Cinnamon Cheerios, 1 c sk milk  Snk (10 AM)-  1 Nutrigrain bar, 1 apple L (1 PM)-  [employer] 6" [place] sub w/ part of bread discarded, honey-must, baked chips, 12 oz lemonade, oatmeal-raisin cookie Snk ( PM)-  --- D (9:20 PM)-  1 c rice, 4 oz chx brst w/ mushrms, pep's, crm of chx soup, 1 1/2 c broccoli, 1 T drsng, water Snk ( PM)-  --- Typical day? No.  This was a "good" day;  differs on other days in that she might have foods like hot dogs, [place] fries, and sweets (~3 X wk).   Progress Towards Goal(s):  In progress.   Nutritional Diagnosis:  Some progress noted on NI-5.8.3 Inappropriate intake of types of carbohydrates (specify): (sugar) As related to beverages.  As evidenced by reduced intake of lemonade and sweet tea (usually <7 X wk).    Intervention:  Nutrition edcuation.  Handouts given during visit include:  AVS  Demonstrated degree of understanding via:  Teach Back   Monitoring/Evaluation:  Dietary intake, exercise, FBG, and body weight in 8 week(s).  No appts available sooner.

## 2016-06-07 NOTE — Patient Instructions (Addendum)
-   Sugar, fat, and salt are HYPER-PALATABLE ingredients.  Research has shown that foods with a lot of these ingredients cause [place] to want more.  They hit the reward center in the brain just the way addictive drugs do.     Diet Recommendations for Diabetes   Starchy (carb) foods: Bread, rice, pasta, potatoes, corn, cereal, grits, crackers, bagels, muffins, all baked goods.  (Fruits, milk, and yogurt also have carbohydrate, but most of these foods will not spike your blood sugar as the starchy foods will.)  A few fruits do cause high blood sugars; use small portions of bananas (limit to 1/2 at a time), grapes, watermelon, oranges, and most tropical fruits.  Read the labels on yogurt, and choose the ones with the least sugar.    Protein foods: Meat, fish, poultry, eggs, dairy foods, and beans such as pinto and kidney beans (beans also provide carbohydrate).   1. Eat at least 3 meals and 1-2 snacks per day. Never go more than 4 hours while awake without eating. Eat breakfast within the first hour of getting up.  [Cereal: The best choices will have at least 5 grams of fiber per serving, i.e., Wheat Chex, Frosted Mini-Wheats, All Bran, and most bran cereals. Also keep in mind that 1 tsp of sugar = 4 grams of sugar on the label.] 2. Limit starchy foods to TWO per meal and ONE per snack. ONE portion of a starchy  food is equal to the following:   - ONE slice of bread (or its equivalent, such as half of a hamburger bun).   - 1/2 cup of a "scoopable" starchy food such as potatoes or rice.   - 15 grams of carbohydrate as shown on food label.  3. Include at every meal: a protein food, a carb food, and vegetables and/or fruit.   - Obtain twice the volume of veg's as protein or carbohydrate foods for both lunch and dinner.   - Fresh or frozen veg's are best.   - Keep frozen veg's on hand for a quick vegetable serving.      - ALSO: At least 150 minutes of exercise.  (And make an effort to move intermittently  through the day.)

## 2016-07-03 ENCOUNTER — Telehealth: Payer: Self-pay | Admitting: Surgery

## 2016-07-03 NOTE — Telephone Encounter (Signed)
I called the patient and left message for her regarding the appt I scheduled for her with Dr.Brabham on 07/30/16 at 10:30am. awt

## 2016-07-03 NOTE — Telephone Encounter (Signed)
-----   Message from Serafina Mitchell, MD sent at 07/02/2016  2:00 PM EDT ----- Regarding: RE: Appt Question Contact: [phone] No ct ----- Message ----- From: Lujean Amel Sent: 06/29/2016   4:00 PM To: Serafina Mitchell, MD Subject: FW: Appt Question                              Dr.Brabham, Dr.Ghanji's office wants to reschedule this missed appointment for your patient. Please see my note below. I need advisement on how to proceed with scheduling her appointment. Thanks, Anne Ng ----- Message ----- From: Mena Goes, RN Sent: 06/29/2016   2:05 PM To: Lujean Amel Subject: RE: Appt Question                              Forward this message to Dr. Trula Slade or ask him next time he's in the office.  Sorry.  ----- Message ----- From: Lujean Amel Sent: 06/29/2016  12:30 PM To: Mena Goes, RN Subject: Appt Question                                  Kandy Garrison from Dr.Ghanji's office called to reschedule the pt's missed appt back in January of 2017. In that appt note from Dec it states the patient was supposed to have CTA neck prior to seeing him in Jan, The patient states the CTA she had messed up her kidney?? Please advise how I should schedule this appt? CT or not? Thanks, Anne Ng

## 2016-07-25 ENCOUNTER — Encounter: Payer: Self-pay | Admitting: Surgery

## 2016-07-30 ENCOUNTER — Ambulatory Visit (INDEPENDENT_AMBULATORY_CARE_PROVIDER_SITE_OTHER): Payer: Commercial Managed Care - HMO | Admitting: Surgery

## 2016-07-30 ENCOUNTER — Encounter: Payer: Self-pay | Admitting: Surgery

## 2016-07-30 VITALS — BP 161/79 | HR 68 | Temp 99.2°F | Ht 64.5 in | Wt 283.0 lb

## 2016-07-30 DIAGNOSIS — I6522 Occlusion and stenosis of left carotid artery: Secondary | ICD-10-CM | POA: Diagnosis not present

## 2016-07-30 NOTE — Progress Notes (Signed)
[facility] [place]  Patient name: Julie [name] MRN: [id number] DOB: 02-11-1956 Sex: female  REASON FOR VISIT: Follow-up for carotid stenosis  HPI: Julie [name] is a 60 y.o. female who returns today for follow-up of her left carotid stenosis.  I first saw her in December 2016.  She had a Doppler study that revealed a high-grade stenosis within the distal left common carotid artery.  The left carotid could not be visualized given the high bifurcation.  I had recommended a CT scan.  She was unable to get this because of elevated creatinine.  The patient recently underwent carotid angiography which define the lesion is greater than 80% in the mid common carotid artery.  She remains asymptomatic.  Specifically, she denies numbness or weakness in either extremity.  She denies slurred speech.  She denies amaurosis fugax.  She takes a statin for hypercholesterolemia.  She has a history of premature cardiac disease in her family.  Both of her parents died age 26 of a heart attack.  The patient also suffers from type 2 diabetes.  She denies having any neurologic symptoms.  Specifically, she denies numbness or weakness in either extremity.  She denies third speech pitching denies amaurosis fugax.  She currently is on dual antiplatelet therapy with aspirin and Plavix.  She denies any cardiac symptoms.  Past Medical History:  Diagnosis Date  . Abnormal thyroid scan    decreased thyroid  . Actinomyces infection    h/o  . Anovulation    chronic  . Encounter for IUD insertion 09/15/2010 and 09/17/2005   Mirena  . Encounter for IUD insertion 06/02/1990   Paraguard  . H/O amenorrhea   . Hx of elevated lipids   . Hypertension   . Hypothyroidism   . Obesity   . Oligomenorrhea    h/o  . PCOS (polycystic ovarian syndrome)   . Type 2 diabetes mellitus (HCC)     Family History  Problem Relation Age of Onset  . Hypertension Mother   .  Diabetes Mother   . Hypertension Maternal Grandmother   . Hypertension Paternal Grandmother   . Diabetes Father     SOCIAL HISTORY: Social History  Substance Use Topics  . Smoking status: Never Smoker  . Smokeless tobacco: Never Used  . Alcohol use No    Allergies  Allergen Reactions  . Sulfa Antibiotics Rash and Other (See Comments)    Wheezing    Current Outpatient Prescriptions  Medication Sig Dispense Refill  . amLODipine (NORVASC) 10 MG tablet Take 10 mg by mouth daily.  1  . aspirin 81 MG chewable tablet Chew 1 tablet (81 mg total) by mouth daily.    . cloNIDine (CATAPRES - DOSED IN MG/24 HR) 0.3 mg/24hr patch Place 1 patch onto the skin once a week.  2  . clopidogrel (PLAVIX) 75 MG tablet Take 1 tablet (75 mg total) by mouth daily with breakfast. 90 tablet 1  . ergocalciferol (VITAMIN D2) 50000 UNITS capsule Take 50,000 Units by mouth every Monday.     . Insulin Lispro (HUMALOG PEN [place]) Inject into the skin. 35 units at breakfast; 30 units at dinner.    . levothyroxine (SYNTHROID, LEVOTHROID) 137 MCG tablet Take 137 mcg by mouth daily.    . Nebivolol HCl (BYSTOLIC PO) Take by mouth.    . potassium chloride SA (K-DUR,KLOR-CON) 20 MEQ tablet Take 20 mEq by mouth 2 (two) times daily.    . rosuvastatin (CRESTOR) 40 MG tablet  Take 40 mg by mouth daily.    Marland Kitchen ZIOPTAN 0.0015 % SOLN Place 1 drop into both eyes every evening.  12  . atenolol (TENORMIN) 100 MG tablet Take 100 mg by mouth 2 (two) times daily.      No current facility-administered medications for this visit.     REVIEW OF SYSTEMS:  [X]  denotes positive finding, [ ]  denotes negative finding Cardiac  Comments:  Chest pain or chest pressure:    Shortness of breath upon exertion:    Short of breath when lying flat:    Irregular heart rhythm:        Vascular    Pain in calf, thigh, or hip brought on by ambulation:    Pain in feet at night that wakes you up from your sleep:     Blood clot in your veins:    Leg  swelling:         Pulmonary    Oxygen at home:    Productive cough:     Wheezing:         Neurologic    Sudden weakness in arms or legs:     Sudden numbness in arms or legs:     Sudden onset of difficulty speaking or slurred speech:    Temporary loss of vision in one eye:     Problems with dizziness:         Gastrointestinal    Blood in stool:     Vomited blood:         Genitourinary    Burning when urinating:     Blood in urine:        Psychiatric    Major depression:         Hematologic    Bleeding problems:    Problems with blood clotting too easily:        Skin    Rashes or ulcers:        Constitutional    Fever or chills:      PHYSICAL EXAM: Vitals:   07/30/16 1057 07/30/16 1100  BP: (!) 198/82 (!) 161/79  Pulse: 68   Temp: 99.2 F (37.3 C)   TempSrc: Oral   SpO2: 100%   Weight: 283 lb (128.4 kg)   Height: 5' 4.5" (1.638 m)     GENERAL: The patient is a well-nourished female, in no acute distress. The vital signs are documented above. CARDIAC: There is a regular rate and rhythm.  VASCULAR:  RRR PULMONARY: There is good air exchange bilaterally without wheezing or rales. ABDOMEN: Soft and non-tender with normal pitched bowel sounds.  MUSCULOSKELETAL: There are no major deformities or cyanosis. NEUROLOGIC: No focal weakness or paresthesias are detected. SKIN: There are no ulcers or rashes noted. PSYCHIATRIC: The patient has a normal affect.  DATA:  View to her carotid arteriogram which shows greater than 80% stenosis in the common carotid artery on the left which is located at the hyoid  MEDICAL ISSUES: We discussed our treatment options including stenting, carotid endarterectomy, versus medical management.  Because of the renal insufficiency, I would favor not proceeding with carotid stenting.  We discussed the details of carotid endarterectomy, including the risk of stroke and nerve injury.  All of her questions were answered.  I told her I would  try to hide her incision in a skin fold.  She wants to discuss this with her endocrinologist and will contact me to schedule left carotid endarterectomy.  She will need to be off of  her Plavix for 1 week.    Annamarie Major, MD [facility] [phone] Pager [phone]

## 2016-08-13 ENCOUNTER — Ambulatory Visit: Payer: BC Managed Care – PPO | Admitting: Family Medicine

## 2016-12-28 DIAGNOSIS — I150 Renovascular hypertension: Secondary | ICD-10-CM | POA: Diagnosis not present

## 2016-12-28 DIAGNOSIS — I10 Essential (primary) hypertension: Secondary | ICD-10-CM | POA: Diagnosis not present

## 2016-12-28 DIAGNOSIS — N182 Chronic kidney disease, stage 2 (mild): Secondary | ICD-10-CM | POA: Diagnosis not present

## 2017-01-08 ENCOUNTER — Telehealth: Payer: Self-pay | Admitting: *Deleted

## 2017-01-08 NOTE — Telephone Encounter (Signed)
LMTCB- I was going through our Will Call Back files. Patient was seen by Dr. Trula Slade on 07-30-16 and was to call back to schedule a Left CEA for 80% carotid stenosis. Pt wanted to discuss this surgery with her endocrinologist and then she was instructed to call us back to set up surgery. Since it has been so long, she would need an appt with Dr. Trula Slade to update her H&P information and discuss surgery. Will place this form back in our pending file.

## 2017-02-15 DIAGNOSIS — E7800 Pure hypercholesterolemia, unspecified: Secondary | ICD-10-CM | POA: Diagnosis not present

## 2017-02-15 DIAGNOSIS — I10 Essential (primary) hypertension: Secondary | ICD-10-CM | POA: Diagnosis not present

## 2017-02-15 DIAGNOSIS — E039 Hypothyroidism, unspecified: Secondary | ICD-10-CM | POA: Diagnosis not present

## 2017-02-15 DIAGNOSIS — E1165 Type 2 diabetes mellitus with hyperglycemia: Secondary | ICD-10-CM | POA: Diagnosis not present

## 2017-02-15 DIAGNOSIS — E559 Vitamin D deficiency, unspecified: Secondary | ICD-10-CM | POA: Diagnosis not present

## 2017-03-26 DIAGNOSIS — I150 Renovascular hypertension: Secondary | ICD-10-CM | POA: Diagnosis not present

## 2017-03-26 DIAGNOSIS — I6523 Occlusion and stenosis of bilateral carotid arteries: Secondary | ICD-10-CM | POA: Diagnosis not present

## 2017-03-26 DIAGNOSIS — I10 Essential (primary) hypertension: Secondary | ICD-10-CM | POA: Diagnosis not present

## 2017-04-03 DIAGNOSIS — E039 Hypothyroidism, unspecified: Secondary | ICD-10-CM | POA: Diagnosis not present

## 2017-04-18 DIAGNOSIS — H401132 Primary open-angle glaucoma, bilateral, moderate stage: Secondary | ICD-10-CM | POA: Diagnosis not present

## 2017-05-17 DIAGNOSIS — H401132 Primary open-angle glaucoma, bilateral, moderate stage: Secondary | ICD-10-CM | POA: Diagnosis not present

## 2017-05-24 DIAGNOSIS — I150 Renovascular hypertension: Secondary | ICD-10-CM | POA: Diagnosis not present

## 2017-05-24 DIAGNOSIS — I10 Essential (primary) hypertension: Secondary | ICD-10-CM | POA: Diagnosis not present

## 2017-05-24 DIAGNOSIS — I6523 Occlusion and stenosis of bilateral carotid arteries: Secondary | ICD-10-CM | POA: Diagnosis not present

## 2017-05-29 DIAGNOSIS — H401132 Primary open-angle glaucoma, bilateral, moderate stage: Secondary | ICD-10-CM | POA: Diagnosis not present

## 2017-06-20 DIAGNOSIS — I10 Essential (primary) hypertension: Secondary | ICD-10-CM | POA: Diagnosis not present

## 2017-06-20 DIAGNOSIS — E7800 Pure hypercholesterolemia, unspecified: Secondary | ICD-10-CM | POA: Diagnosis not present

## 2017-06-20 DIAGNOSIS — E1165 Type 2 diabetes mellitus with hyperglycemia: Secondary | ICD-10-CM | POA: Diagnosis not present

## 2017-08-20 DIAGNOSIS — I150 Renovascular hypertension: Secondary | ICD-10-CM | POA: Diagnosis not present

## 2017-08-20 DIAGNOSIS — I10 Essential (primary) hypertension: Secondary | ICD-10-CM | POA: Diagnosis not present

## 2017-08-20 DIAGNOSIS — I6523 Occlusion and stenosis of bilateral carotid arteries: Secondary | ICD-10-CM | POA: Diagnosis not present

## 2017-09-02 DIAGNOSIS — Z01419 Encounter for gynecological examination (general) (routine) without abnormal findings: Secondary | ICD-10-CM | POA: Diagnosis not present

## 2017-09-02 DIAGNOSIS — Z1231 Encounter for screening mammogram for malignant neoplasm of breast: Secondary | ICD-10-CM | POA: Diagnosis not present

## 2017-10-16 DIAGNOSIS — E1165 Type 2 diabetes mellitus with hyperglycemia: Secondary | ICD-10-CM | POA: Diagnosis not present

## 2017-10-16 DIAGNOSIS — E7800 Pure hypercholesterolemia, unspecified: Secondary | ICD-10-CM | POA: Diagnosis not present

## 2017-10-16 DIAGNOSIS — E039 Hypothyroidism, unspecified: Secondary | ICD-10-CM | POA: Diagnosis not present

## 2017-10-25 DIAGNOSIS — H401132 Primary open-angle glaucoma, bilateral, moderate stage: Secondary | ICD-10-CM | POA: Diagnosis not present

## 2017-11-08 DIAGNOSIS — I10 Essential (primary) hypertension: Secondary | ICD-10-CM | POA: Diagnosis not present

## 2017-11-08 DIAGNOSIS — E7800 Pure hypercholesterolemia, unspecified: Secondary | ICD-10-CM | POA: Diagnosis not present

## 2017-11-08 DIAGNOSIS — E1165 Type 2 diabetes mellitus with hyperglycemia: Secondary | ICD-10-CM | POA: Diagnosis not present

## 2017-11-08 DIAGNOSIS — Z23 Encounter for immunization: Secondary | ICD-10-CM | POA: Diagnosis not present

## 2017-11-11 DIAGNOSIS — I6523 Occlusion and stenosis of bilateral carotid arteries: Secondary | ICD-10-CM | POA: Diagnosis not present

## 2017-11-28 DIAGNOSIS — I6523 Occlusion and stenosis of bilateral carotid arteries: Secondary | ICD-10-CM | POA: Diagnosis not present

## 2017-11-28 DIAGNOSIS — I150 Renovascular hypertension: Secondary | ICD-10-CM | POA: Diagnosis not present

## 2017-11-28 DIAGNOSIS — I10 Essential (primary) hypertension: Secondary | ICD-10-CM | POA: Diagnosis not present

## 2017-12-20 DIAGNOSIS — I10 Essential (primary) hypertension: Secondary | ICD-10-CM | POA: Diagnosis not present

## 2017-12-20 DIAGNOSIS — E1165 Type 2 diabetes mellitus with hyperglycemia: Secondary | ICD-10-CM | POA: Diagnosis not present

## 2017-12-20 DIAGNOSIS — E7800 Pure hypercholesterolemia, unspecified: Secondary | ICD-10-CM | POA: Diagnosis not present

## 2018-02-25 DIAGNOSIS — I10 Essential (primary) hypertension: Secondary | ICD-10-CM | POA: Diagnosis not present

## 2018-02-25 DIAGNOSIS — Z0000 Encounter for general adult medical examination without abnormal findings: Secondary | ICD-10-CM | POA: Diagnosis not present

## 2018-02-25 DIAGNOSIS — E782 Mixed hyperlipidemia: Secondary | ICD-10-CM | POA: Diagnosis not present

## 2018-02-25 DIAGNOSIS — E039 Hypothyroidism, unspecified: Secondary | ICD-10-CM | POA: Diagnosis not present

## 2018-02-26 ENCOUNTER — Encounter: Payer: Self-pay | Admitting: Physician Assistant

## 2018-03-05 DIAGNOSIS — H401132 Primary open-angle glaucoma, bilateral, moderate stage: Secondary | ICD-10-CM | POA: Diagnosis not present

## 2018-03-06 DIAGNOSIS — R262 Difficulty in walking, not elsewhere classified: Secondary | ICD-10-CM | POA: Diagnosis not present

## 2018-03-06 DIAGNOSIS — M545 Low back pain: Secondary | ICD-10-CM | POA: Diagnosis not present

## 2018-03-06 DIAGNOSIS — M2560 Stiffness of unspecified joint, not elsewhere classified: Secondary | ICD-10-CM | POA: Diagnosis not present

## 2018-03-11 DIAGNOSIS — R262 Difficulty in walking, not elsewhere classified: Secondary | ICD-10-CM | POA: Diagnosis not present

## 2018-03-11 DIAGNOSIS — M2560 Stiffness of unspecified joint, not elsewhere classified: Secondary | ICD-10-CM | POA: Diagnosis not present

## 2018-03-11 DIAGNOSIS — M545 Low back pain: Secondary | ICD-10-CM | POA: Diagnosis not present

## 2018-03-13 DIAGNOSIS — R262 Difficulty in walking, not elsewhere classified: Secondary | ICD-10-CM | POA: Diagnosis not present

## 2018-03-13 DIAGNOSIS — M545 Low back pain: Secondary | ICD-10-CM | POA: Diagnosis not present

## 2018-03-13 DIAGNOSIS — M2560 Stiffness of unspecified joint, not elsewhere classified: Secondary | ICD-10-CM | POA: Diagnosis not present

## 2018-03-18 DIAGNOSIS — M2560 Stiffness of unspecified joint, not elsewhere classified: Secondary | ICD-10-CM | POA: Diagnosis not present

## 2018-03-18 DIAGNOSIS — M545 Low back pain: Secondary | ICD-10-CM | POA: Diagnosis not present

## 2018-03-18 DIAGNOSIS — R262 Difficulty in walking, not elsewhere classified: Secondary | ICD-10-CM | POA: Diagnosis not present

## 2018-03-20 DIAGNOSIS — M545 Low back pain: Secondary | ICD-10-CM | POA: Diagnosis not present

## 2018-03-20 DIAGNOSIS — M2560 Stiffness of unspecified joint, not elsewhere classified: Secondary | ICD-10-CM | POA: Diagnosis not present

## 2018-03-20 DIAGNOSIS — R262 Difficulty in walking, not elsewhere classified: Secondary | ICD-10-CM | POA: Diagnosis not present

## 2018-03-21 ENCOUNTER — Ambulatory Visit (INDEPENDENT_AMBULATORY_CARE_PROVIDER_SITE_OTHER): Payer: 59 | Admitting: Physician Assistant

## 2018-03-21 ENCOUNTER — Telehealth: Payer: Self-pay | Admitting: Emergency Medicine

## 2018-03-21 ENCOUNTER — Encounter: Payer: Self-pay | Admitting: Physician Assistant

## 2018-03-21 VITALS — BP 132/74 | HR 70 | Ht 64.0 in | Wt 290.2 lb

## 2018-03-21 DIAGNOSIS — Z7901 Long term (current) use of anticoagulants: Secondary | ICD-10-CM | POA: Diagnosis not present

## 2018-03-21 DIAGNOSIS — Z01818 Encounter for other preprocedural examination: Secondary | ICD-10-CM

## 2018-03-21 DIAGNOSIS — Z1212 Encounter for screening for malignant neoplasm of rectum: Secondary | ICD-10-CM | POA: Diagnosis not present

## 2018-03-21 DIAGNOSIS — Z1211 Encounter for screening for malignant neoplasm of colon: Secondary | ICD-10-CM

## 2018-03-21 MED ORDER — NA SULFATE-K SULFATE-MG SULF 17.5-3.13-1.6 GM/177ML PO SOLN: 1.0000 | ORAL | 0 refills | Status: DC

## 2018-03-21 NOTE — Telephone Encounter (Signed)
   [name] 1956/10/18 [id number]  Dear Dr. Einar Gip:  We have scheduled the above named patient for a colonsocopy procedure. Our records show that she is on anticoagulation therapy.  Please advise as to whether the patient may come off their therapy of Plavix 5 days prior to their procedure which is scheduled for 05-30-18.  Please route your response to Tinnie Gens, CMA or fax response to [phone].  Sincerely,    [facility]

## 2018-03-21 NOTE — Patient Instructions (Signed)

## 2018-03-21 NOTE — Progress Notes (Signed)
Chief Complaint: Consult for screening colonoscopy in a patient on chronic anticoagulation  HPI:    [name] is a 62 year old African-American female with a past medical history as listed below including carotid artery stenosis on Plavix, renal stenting, CKD stage III, who was referred to me by Carol Ada, MD for a consult of a screening colonoscopy.      Labs 02/24/2018 show a creatinine elevated at 2.02, otherwise normal CMP and CBC.    Today, explains that she is due for a screening colonoscopy.  Denies any GI complaints but is somewhat nervous about the procedure in general.  Patient describes being on Plavix over the past 2 -3 years for renal stenting and coronary artery disease.  She has never had to stop this medication in the past.    Denies fever, chills, blood in her stool, change in bowel habits or abdominal pain.  Past Medical History:  Diagnosis Date  . Abnormal thyroid scan    decreased thyroid  . Actinomyces infection    h/o  . Anovulation    chronic  . Encounter for IUD insertion 09/15/2010 and 09/17/2005   Mirena  . Encounter for IUD insertion 06/02/1990   Paraguard  . H/O amenorrhea   . Hx of elevated lipids   . Hypertension   . Hypothyroidism   . Obesity   . Oligomenorrhea    h/o  . PCOS (polycystic ovarian syndrome)   . Type 2 diabetes mellitus ([place])     Past Surgical History:  Procedure Laterality Date  . PERIPHERAL VASCULAR CATHETERIZATION N/A 07/05/2015   Procedure: Renal Angiography;  Surgeon: Adrian Prows, MD;  Location: [place] [facility];  Service: Cardiovascular;  Laterality: N/A;  . PERIPHERAL VASCULAR CATHETERIZATION Bilateral 07/05/2015   Procedure: Renal Intervention;  Surgeon: Adrian Prows, MD;  Location: [place] [facility];  Service: Cardiovascular;  Laterality: Bilateral;  right and left renal stent placement  . PERIPHERAL VASCULAR CATHETERIZATION N/A 12/16/2015   Procedure: Renal Angiography;  Surgeon: Adrian Prows, MD;  Location: [place] [facility];  Service: Cardiovascular;  Laterality: N/A;  . PERIPHERAL VASCULAR CATHETERIZATION Left 12/16/2015   Procedure: Carotid Angiography/Bilateral;  Surgeon: Adrian Prows, MD;  Location: [facility];  Service: Cardiovascular;  Laterality: Left;  . PERIPHERAL VASCULAR CATHETERIZATION  12/16/2015   Procedure: Peripheral Vascular Balloon Angioplasty;  Surgeon: Adrian Prows, MD;  Location: [facility];  Service: Cardiovascular;;  both sides  . THYROID EXPLORATION     PT STATES HAD PROCEDURE DONE ON THYROID  . WISDOM TOOTH EXTRACTION  1976    Current Outpatient Medications  Medication Sig Dispense Refill  . amLODipine (NORVASC) 10 MG tablet Take 10 mg by mouth daily.  1  . aspirin 81 MG chewable tablet Chew 1 tablet (81 mg total) by mouth daily.    Marland Kitchen atenolol (TENORMIN) 100 MG tablet Take 100 mg by mouth 2 (two) times daily.     . cloNIDine (CATAPRES - DOSED IN MG/24 HR) 0.3 mg/24hr patch Place 1 patch onto the skin once a week.  2  . clopidogrel (PLAVIX) 75 MG tablet Take 1 tablet (75 mg total) by mouth daily with breakfast. 90 tablet 1  . ergocalciferol (VITAMIN D2) 50000 UNITS capsule Take 50,000 Units by mouth every Monday.     . Insulin Lispro (HUMALOG PEN [place]) Inject into the skin. 35 units at breakfast; 30 units at dinner.    . levothyroxine (SYNTHROID, LEVOTHROID) 137 MCG tablet Take 137 mcg by mouth daily.    Marland Kitchen  Nebivolol HCl (BYSTOLIC PO) Take by mouth.    . potassium chloride SA (K-DUR,KLOR-CON) 20 MEQ tablet Take 20 mEq by mouth 2 (two) times daily.    . rosuvastatin (CRESTOR) 40 MG tablet Take 40 mg by mouth daily.    Marland Kitchen ZIOPTAN 0.0015 % SOLN Place 1 drop into both eyes every evening.  12   No current facility-administered medications for this visit.     Allergies as of 03/21/2018 - Review Complete 07/30/2016  Allergen Reaction Noted  . Sulfa antibiotics Rash and Other (See Comments) 07/04/2015    Family History  Problem Relation Age of Onset  . Hypertension Mother   .  Diabetes Mother   . Hypertension Maternal Grandmother   . Hypertension Paternal Grandmother   . Diabetes Father     Social History   Socioeconomic History  . Marital status: Married    Spouse name: Not on file  . Number of children: Not on file  . Years of education: Not on file  . Highest education level: Not on file  Occupational History  . Not on file  Social Needs  . Financial resource strain: Not on file  . Food insecurity:    Worry: Not on file    Inability: Not on file  . Transportation needs:    Medical: Not on file    Non-medical: Not on file  Tobacco Use  . Smoking status: Never Smoker  . Smokeless tobacco: Never Used  Substance and Sexual Activity  . Alcohol use: No  . Drug use: No  . Sexual activity: Yes    Birth control/protection: IUD    Comment: MIRENA  Lifestyle  . Physical activity:    Days per week: Not on file    Minutes per session: Not on file  . Stress: Not on file  Relationships  . Social connections:    Talks on phone: Not on file    Gets together: Not on file    Attends religious service: Not on file    Active member of club or organization: Not on file    Attends meetings of clubs or organizations: Not on file    Relationship status: Not on file  . Intimate partner violence:    Fear of current or ex partner: Not on file    Emotionally abused: Not on file    Physically abused: Not on file    Forced sexual activity: Not on file  Other Topics Concern  . Not on file  Social History Narrative  . Not on file    Review of Systems:    Constitutional: No weight loss, fever or chills Skin: No rash  Cardiovascular: No chest pain Respiratory: No SOB  Gastrointestinal: See HPI and otherwise negative Genitourinary: No dysuria  Neurological: No headache, dizziness or syncope Musculoskeletal: No new muscle or joint pain Hematologic: No bleeding  Psychiatric: No history of depression or anxiety   Physical Exam:  Vital signs: BP 132/74    Pulse 70   Ht 5\' 4"  (1.626 m)   Wt 290 lb 4 oz (131.7 kg)   BMI 49.82 kg/m    Constitutional:   Pleasant obese AA female appears to be in NAD, Well developed, Well nourished, alert and cooperative Head:  Normocephalic and atraumatic. Eyes:   PEERL, EOMI. No icterus. Conjunctiva pink. Ears:  Normal auditory acuity. Neck:  Supple Throat: Oral cavity and pharynx without inflammation, swelling or lesion.  Respiratory: Respirations even and unlabored. Lungs clear to auscultation bilaterally.   No  wheezes, crackles, or rhonchi.  Cardiovascular: Normal S1, S2. No MRG. Regular rate and rhythm. No peripheral edema, cyanosis or pallor.  Gastrointestinal:  Soft, nondistended, nontender. No rebound or guarding. Normal bowel sounds. No appreciable masses or hepatomegaly. Rectal:  Not performed.  Msk:  Symmetrical without gross deformities. Without edema, no deformity or joint abnormality.  Neurologic:  Alert and  oriented x4;  grossly normal neurologically.  Skin:   Dry and intact without significant lesions or rashes. Psychiatric: Demonstrates good judgement and reason without abnormal affect or behaviors.  See HPI for recent labs.  Assessment: 1.  Screening for colorectal cancer: Patient is 53 and never had a previous colonoscopy 2.  Chronic anticoagulation: With Plavix status post renal stenting 2017 and for CAD  Plan: 1.  Scheduled patient for screening colonoscopy in the [place] with Dr. Loletha Carrow, did discuss risk, benefits, limitations alternatives and patient agrees to proceed. 2.  Patient was advised to hold her Plavix for 5 days prior to her procedure.  We will contact her physician to ensure that holding her Plavix is acceptable for her. 3.  Spent time discussing the colonoscopy procedure with the patient in detail, using diagrams in the room.  Answered all of her questions and calmed her fears. 4.  Patient to follow in clinic per recommendations from Dr. Loletha Carrow after time of  procedure.  Julie Newer, PA-C [facility] 03/21/2018, 8:58 AM  Cc: Carol Ada, MD

## 2018-03-22 NOTE — Progress Notes (Signed)
Thank you for sending this case to me. I have reviewed the entire note, and the outlined plan seems appropriate.  Her vascular interventions are far enough in the past that I expect her prescribing physician will be agreeable to holding the plavix 5 days before colonoscopy.  I agree with asking them to be certain.  Wilfrid Lund, MD

## 2018-03-25 DIAGNOSIS — M545 Low back pain: Secondary | ICD-10-CM | POA: Diagnosis not present

## 2018-03-25 DIAGNOSIS — M2560 Stiffness of unspecified joint, not elsewhere classified: Secondary | ICD-10-CM | POA: Diagnosis not present

## 2018-03-25 DIAGNOSIS — R262 Difficulty in walking, not elsewhere classified: Secondary | ICD-10-CM | POA: Diagnosis not present

## 2018-03-27 DIAGNOSIS — R262 Difficulty in walking, not elsewhere classified: Secondary | ICD-10-CM | POA: Diagnosis not present

## 2018-03-27 DIAGNOSIS — M545 Low back pain: Secondary | ICD-10-CM | POA: Diagnosis not present

## 2018-03-27 DIAGNOSIS — M2560 Stiffness of unspecified joint, not elsewhere classified: Secondary | ICD-10-CM | POA: Diagnosis not present

## 2018-03-31 NOTE — Telephone Encounter (Signed)
Left message on patients voicemail to call office.   

## 2018-03-31 NOTE — Telephone Encounter (Signed)
Spoke to patient and informed her to hold Plavix 5 days before procedure. She verbalized understanding and states that she also needs a prep or a coupon because it is too expensive. I informed her that we do not have any samples in the office right now but since her procedure is not until August that she can call back in and check 1-2 weeks before. Patient verbalized understanding.

## 2018-03-31 NOTE — Telephone Encounter (Signed)
Patient returning call about holding plavix. Patient also wanting to know if she can get coupon for prep.

## 2018-04-03 DIAGNOSIS — R262 Difficulty in walking, not elsewhere classified: Secondary | ICD-10-CM | POA: Diagnosis not present

## 2018-04-03 DIAGNOSIS — M545 Low back pain: Secondary | ICD-10-CM | POA: Diagnosis not present

## 2018-04-03 DIAGNOSIS — M2560 Stiffness of unspecified joint, not elsewhere classified: Secondary | ICD-10-CM | POA: Diagnosis not present

## 2018-04-09 DIAGNOSIS — M545 Low back pain: Secondary | ICD-10-CM | POA: Diagnosis not present

## 2018-04-09 DIAGNOSIS — R262 Difficulty in walking, not elsewhere classified: Secondary | ICD-10-CM | POA: Diagnosis not present

## 2018-04-09 DIAGNOSIS — M2560 Stiffness of unspecified joint, not elsewhere classified: Secondary | ICD-10-CM | POA: Diagnosis not present

## 2018-04-18 DIAGNOSIS — E039 Hypothyroidism, unspecified: Secondary | ICD-10-CM | POA: Diagnosis not present

## 2018-04-18 DIAGNOSIS — E1165 Type 2 diabetes mellitus with hyperglycemia: Secondary | ICD-10-CM | POA: Diagnosis not present

## 2018-04-18 DIAGNOSIS — E7800 Pure hypercholesterolemia, unspecified: Secondary | ICD-10-CM | POA: Diagnosis not present

## 2018-04-25 DIAGNOSIS — I10 Essential (primary) hypertension: Secondary | ICD-10-CM | POA: Diagnosis not present

## 2018-04-25 DIAGNOSIS — E7800 Pure hypercholesterolemia, unspecified: Secondary | ICD-10-CM | POA: Diagnosis not present

## 2018-04-25 DIAGNOSIS — E1165 Type 2 diabetes mellitus with hyperglycemia: Secondary | ICD-10-CM | POA: Diagnosis not present

## 2018-05-30 ENCOUNTER — Encounter: Payer: Self-pay | Admitting: Gastroenterology

## 2018-05-30 ENCOUNTER — Ambulatory Visit (AMBULATORY_SURGERY_CENTER): Payer: 59 | Admitting: Gastroenterology

## 2018-05-30 VITALS — BP 142/52 | HR 60 | Temp 98.6°F | Resp 19 | Ht 64.0 in | Wt 290.0 lb

## 2018-05-30 DIAGNOSIS — K621 Rectal polyp: Secondary | ICD-10-CM

## 2018-05-30 DIAGNOSIS — Z1212 Encounter for screening for malignant neoplasm of rectum: Secondary | ICD-10-CM

## 2018-05-30 DIAGNOSIS — D123 Benign neoplasm of transverse colon: Secondary | ICD-10-CM

## 2018-05-30 DIAGNOSIS — D129 Benign neoplasm of anus and anal canal: Secondary | ICD-10-CM

## 2018-05-30 DIAGNOSIS — D128 Benign neoplasm of rectum: Secondary | ICD-10-CM

## 2018-05-30 DIAGNOSIS — Z1211 Encounter for screening for malignant neoplasm of colon: Secondary | ICD-10-CM

## 2018-05-30 DIAGNOSIS — E119 Type 2 diabetes mellitus without complications: Secondary | ICD-10-CM | POA: Diagnosis not present

## 2018-05-30 MED ORDER — SODIUM CHLORIDE 0.9 % IV SOLN: 500.0000 mL | Freq: Once | INTRAVENOUS | Status: DC

## 2018-05-30 NOTE — Progress Notes (Signed)
Report given to PACU, vss 

## 2018-05-30 NOTE — Op Note (Signed)
[facility] Patient Name: Julie [name] Procedure Date: 05/30/2018 9:13 AM MRN: [id number] Endoscopist: Mallie Mussel L. Loletha Carrow , MD Age: 62 Referring MD:  Date of Birth: [birth year] Gender: Female Account #: [id number] Procedure:                Colonoscopy Indications:              Screening for colorectal malignant neoplasm, This                            is the patient's first colonoscopy Medicines:                Monitored Anesthesia Care Procedure:                Pre-Anesthesia Assessment:                           - Prior to the procedure, a History and Physical                            was performed, and patient medications and                            allergies were reviewed. The patient's tolerance of                            previous anesthesia was also reviewed. The risks                            and benefits of the procedure and the sedation                            options and risks were discussed with the patient.                            All questions were answered, and informed consent                            was obtained. Prior Anticoagulants: The patient has                            taken Plavix (clopidogrel), last dose was 5 days                            prior to procedure. ASA Grade Assessment: III - A                            patient with severe systemic disease. After                            reviewing the risks and benefits, the patient was                            deemed in satisfactory condition to undergo the  procedure.                           After obtaining informed consent, the colonoscope                            was passed under direct vision. Throughout the                            procedure, the patient's blood pressure, pulse, and                            oxygen saturations were monitored continuously. The                            Colonoscope was introduced through the anus and                  advanced to the the cecum, identified by                            appendiceal orifice and ileocecal valve. The                            colonoscopy was performed without difficulty. The                            patient tolerated the procedure well. The quality                            of the bowel preparation was excellent. The                            ileocecal valve, appendiceal orifice, and rectum                            were photographed. The quality of the bowel                            preparation was evaluated using the BBPS [facility]                            Bowel Preparation Scale) with scores of: Right                            Colon = 3, Transverse Colon = 3 and Left Colon = 3                            (entire mucosa seen well with no residual staining,                            small fragments of stool or opaque liquid). The                            total BBPS score  equals 9. The bowel preparation                            used was SUPREP. Scope In: 9:21:51 AM Scope Out: 9:37:19 AM Scope Withdrawal Time: 0 hours 10 minutes 55 seconds  Total Procedure Duration: 0 hours 15 minutes 28 seconds  Findings:                 The perianal and digital rectal examinations were                            normal.                           Two sessile polyps were found in the proximal                            transverse colon and distal transverse colon. The                            polyps were 2 mm in size. These polyps were removed                            with a cold biopsy forceps. Resection and retrieval                            were complete. (Jar 1)                           A 3 mm polyp was found in the rectum. The polyp was                            sessile. The polyp was removed with a cold biopsy                            forceps. Resection and retrieval were complete.                            (Jar 2)                           The  exam was otherwise without abnormality on                            direct and retroflexion views. Complications:            No immediate complications. Estimated Blood Loss:     Estimated blood loss was minimal. Impression:               - Two 2 mm polyps in the proximal transverse colon                            and in the distal transverse colon, removed with a  cold biopsy forceps. Resected and retrieved.                           - One 3 mm polyp in the rectum, removed with a cold                            biopsy forceps. Resected and retrieved.                           - The examination was otherwise normal on direct                            and retroflexion views. Recommendation:           - Patient has a contact number available for                            emergencies. The signs and symptoms of potential                            delayed complications were discussed with the                            patient. Return to normal activities tomorrow.                            Written discharge instructions were provided to the                            patient.                           - Resume previous diet.                           - Continue present medications.                           - Await pathology results.                           - Resume Plavix (clopidogrel) at prior dose today.                           - Repeat colonoscopy is recommended for                            surveillance. The colonoscopy date will be                            determined after pathology results from today's                            exam become available for review. Henry L. Loletha Carrow, MD 05/30/2018 9:44:10 AM This report has been signed electronically.

## 2018-05-30 NOTE — Progress Notes (Signed)
Called to room to assist during endoscopic procedure.  Patient ID and intended procedure confirmed with present staff. Received instructions for my participation in the procedure from the performing physician.  

## 2018-05-30 NOTE — Patient Instructions (Signed)
RESTART PLAVIX TODAY. INFORMATION ON POLYPS GIVEN     YOU HAD AN ENDOSCOPIC PROCEDURE TODAY AT [facility]:   Refer to the procedure report that was given to you for any specific questions about what was found during the examination.  If the procedure report does not answer your questions, please call your gastroenterologist to clarify.  If you requested that your care partner not be given the details of your procedure findings, then the procedure report has been included in a sealed envelope for you to review at your convenience later.  YOU SHOULD EXPECT: Some feelings of bloating in the abdomen. Passage of more gas than usual.  Walking can help get rid of the air that was put into your GI tract during the procedure and reduce the bloating. If you had a lower endoscopy (such as a colonoscopy or flexible sigmoidoscopy) you may notice spotting of blood in your stool or on the toilet paper. If you underwent a bowel prep for your procedure, you may not have a normal bowel movement for a few days.  Please Note:  You might notice some irritation and congestion in your nose or some drainage.  This is from the oxygen used during your procedure.  There is no need for concern and it should clear up in a day or so.  SYMPTOMS TO REPORT IMMEDIATELY:   Following lower endoscopy (colonoscopy or flexible sigmoidoscopy):  Excessive amounts of blood in the stool  Significant tenderness or worsening of abdominal pains  Swelling of the abdomen that is new, acute  Fever of 100F or higher  For urgent or emergent issues, a gastroenterologist can be reached at any hour by calling [phone].   DIET:  We do recommend a small meal at first, but then you may proceed to your regular diet.  Drink plenty of fluids but you should avoid alcoholic beverages for 24 hours.  ACTIVITY:  You should plan to take it easy for the rest of today and you should NOT DRIVE or use heavy machinery until tomorrow  (because of the sedation medicines used during the test).    FOLLOW UP: Our staff will call the number listed on your records the next business day following your procedure to check on you and address any questions or concerns that you may have regarding the information given to you following your procedure. If we do not reach you, we will leave a message.  However, if you are feeling well and you are not experiencing any problems, there is no need to return our call.  We will assume that you have returned to your regular daily activities without incident.  If any biopsies were taken you will be contacted by phone or by letter within the next 1-3 weeks.  Please call us at [phone] if you have not heard about the biopsies in 3 weeks.    SIGNATURES/CONFIDENTIALITY: You and/or your care partner have signed paperwork which will be entered into your electronic medical record.  These signatures attest to the fact that that the information above on your After Visit Summary has been reviewed and is understood.  Full responsibility of the confidentiality of this discharge information lies with you and/or your care-partner.

## 2018-06-02 ENCOUNTER — Telehealth: Payer: Self-pay | Admitting: *Deleted

## 2018-06-02 NOTE — Telephone Encounter (Signed)
  Follow up Call-  Call back number 05/30/2018  Post procedure Call Back phone  # [phone]  Permission to leave phone message No  Some recent data might be hidden     Patient questions:  Do you have a fever, pain , or abdominal swelling? No. Pain Score  0 *  Have you tolerated food without any problems? Yes.    Have you been able to return to your normal activities? Yes.    Do you have any questions about your discharge instructions: Diet   No. Medications  No. Follow up visit  No.  Do you have questions or concerns about your Care? No.  Actions: * If pain score is 4 or above: No action needed, pain <4.

## 2018-06-06 ENCOUNTER — Encounter: Payer: Self-pay | Admitting: Gastroenterology

## 2018-06-13 DIAGNOSIS — I6523 Occlusion and stenosis of bilateral carotid arteries: Secondary | ICD-10-CM | POA: Diagnosis not present

## 2018-06-18 DIAGNOSIS — I6523 Occlusion and stenosis of bilateral carotid arteries: Secondary | ICD-10-CM | POA: Diagnosis not present

## 2018-06-18 DIAGNOSIS — I10 Essential (primary) hypertension: Secondary | ICD-10-CM | POA: Diagnosis not present

## 2018-06-18 DIAGNOSIS — I150 Renovascular hypertension: Secondary | ICD-10-CM | POA: Diagnosis not present

## 2018-07-04 DIAGNOSIS — N183 Chronic kidney disease, stage 3 (moderate): Secondary | ICD-10-CM | POA: Diagnosis not present

## 2018-07-04 DIAGNOSIS — Z23 Encounter for immunization: Secondary | ICD-10-CM | POA: Diagnosis not present

## 2018-07-09 ENCOUNTER — Other Ambulatory Visit: Payer: Self-pay | Admitting: Nephrology

## 2018-07-09 DIAGNOSIS — N183 Chronic kidney disease, stage 3 unspecified: Secondary | ICD-10-CM

## 2018-07-09 DIAGNOSIS — H401132 Primary open-angle glaucoma, bilateral, moderate stage: Secondary | ICD-10-CM | POA: Diagnosis not present

## 2018-07-25 ENCOUNTER — Ambulatory Visit
Admission: RE | Admit: 2018-07-25 | Discharge: 2018-07-25 | Disposition: A | Discharge status: Home / Self Care | Payer: 59 | Source: Ambulatory Visit | Attending: Nephrology | Admitting: Nephrology

## 2018-07-25 DIAGNOSIS — N183 Chronic kidney disease, stage 3 unspecified: Secondary | ICD-10-CM

## 2018-07-25 DIAGNOSIS — N189 Chronic kidney disease, unspecified: Secondary | ICD-10-CM | POA: Diagnosis not present

## 2018-08-26 DIAGNOSIS — I10 Essential (primary) hypertension: Secondary | ICD-10-CM | POA: Diagnosis not present

## 2018-08-26 DIAGNOSIS — E1165 Type 2 diabetes mellitus with hyperglycemia: Secondary | ICD-10-CM | POA: Diagnosis not present

## 2018-08-26 DIAGNOSIS — E7800 Pure hypercholesterolemia, unspecified: Secondary | ICD-10-CM | POA: Diagnosis not present

## 2018-09-08 DIAGNOSIS — Z6841 Body Mass Index (BMI) 40.0 and over, adult: Secondary | ICD-10-CM | POA: Diagnosis not present

## 2018-09-08 DIAGNOSIS — L293 Anogenital pruritus, unspecified: Secondary | ICD-10-CM | POA: Diagnosis not present

## 2018-09-08 DIAGNOSIS — B373 Candidiasis of vulva and vagina: Secondary | ICD-10-CM | POA: Diagnosis not present

## 2018-09-08 DIAGNOSIS — Z01419 Encounter for gynecological examination (general) (routine) without abnormal findings: Secondary | ICD-10-CM | POA: Diagnosis not present

## 2018-10-02 DIAGNOSIS — N183 Chronic kidney disease, stage 3 (moderate): Secondary | ICD-10-CM | POA: Diagnosis not present

## 2018-10-06 DIAGNOSIS — I10 Essential (primary) hypertension: Secondary | ICD-10-CM | POA: Diagnosis not present

## 2018-10-06 DIAGNOSIS — N183 Chronic kidney disease, stage 3 (moderate): Secondary | ICD-10-CM | POA: Diagnosis not present

## 2018-10-13 DIAGNOSIS — I10 Essential (primary) hypertension: Secondary | ICD-10-CM | POA: Diagnosis not present

## 2018-11-22 ENCOUNTER — Other Ambulatory Visit: Payer: Self-pay | Admitting: Cardiology

## 2018-11-22 DIAGNOSIS — I6523 Occlusion and stenosis of bilateral carotid arteries: Secondary | ICD-10-CM

## 2018-12-11 DIAGNOSIS — H401132 Primary open-angle glaucoma, bilateral, moderate stage: Secondary | ICD-10-CM | POA: Diagnosis not present

## 2018-12-17 ENCOUNTER — Ambulatory Visit: Payer: 59

## 2018-12-17 DIAGNOSIS — I6523 Occlusion and stenosis of bilateral carotid arteries: Secondary | ICD-10-CM | POA: Diagnosis not present

## 2018-12-22 ENCOUNTER — Telehealth: Payer: Self-pay | Admitting: Cardiology

## 2018-12-23 ENCOUNTER — Other Ambulatory Visit: Payer: Self-pay | Admitting: Cardiology

## 2018-12-23 DIAGNOSIS — I6523 Occlusion and stenosis of bilateral carotid arteries: Secondary | ICD-10-CM

## 2018-12-23 NOTE — Telephone Encounter (Signed)
12/23/18 PATIENT HAS BEEN SCHEDULED FOR 05/20/19 @ 11AM FOR A CAROTID DUPLEX ULTRASOUND.

## 2018-12-26 ENCOUNTER — Ambulatory Visit (INDEPENDENT_AMBULATORY_CARE_PROVIDER_SITE_OTHER): Payer: 59 | Admitting: Cardiology

## 2018-12-26 ENCOUNTER — Encounter: Payer: Self-pay | Admitting: Cardiology

## 2018-12-26 VITALS — BP 142/72 | HR 62 | Ht 65.0 in | Wt 279.0 lb

## 2018-12-26 DIAGNOSIS — E7800 Pure hypercholesterolemia, unspecified: Secondary | ICD-10-CM | POA: Diagnosis not present

## 2018-12-26 DIAGNOSIS — E1165 Type 2 diabetes mellitus with hyperglycemia: Secondary | ICD-10-CM | POA: Diagnosis not present

## 2018-12-26 DIAGNOSIS — I10 Essential (primary) hypertension: Secondary | ICD-10-CM

## 2018-12-26 DIAGNOSIS — I6523 Occlusion and stenosis of bilateral carotid arteries: Secondary | ICD-10-CM | POA: Diagnosis not present

## 2018-12-26 DIAGNOSIS — E1122 Type 2 diabetes mellitus with diabetic chronic kidney disease: Secondary | ICD-10-CM | POA: Diagnosis not present

## 2018-12-26 DIAGNOSIS — Z6841 Body Mass Index (BMI) 40.0 and over, adult: Secondary | ICD-10-CM

## 2018-12-26 DIAGNOSIS — I129 Hypertensive chronic kidney disease with stage 1 through stage 4 chronic kidney disease, or unspecified chronic kidney disease: Secondary | ICD-10-CM | POA: Diagnosis not present

## 2018-12-26 DIAGNOSIS — E66813 Obesity, class 3: Secondary | ICD-10-CM

## 2018-12-26 DIAGNOSIS — E039 Hypothyroidism, unspecified: Secondary | ICD-10-CM | POA: Diagnosis not present

## 2018-12-26 DIAGNOSIS — N184 Chronic kidney disease, stage 4 (severe): Secondary | ICD-10-CM

## 2018-12-26 NOTE — Progress Notes (Signed)
Subjective:   Julie [name], female    DOB: 1956-03-11, 63 y.o.   MRN: [id number]  Carol [facility], MD:  Chief Complaint  Patient presents with  . Hypertension  . Carotid    carotid artery stenosis  . Follow-up    6 mth fu, carotid results, Last EKG 06/18/18    HPI: Julie [name]  is a 63 y.o. female  with ong-standing history of difficult to control hypertension, has had bilateral renal artery stenting for high-grade bilateral renal artery stenosis in September 2016, stage IV chronic kidney disease secondary to uncontrolled diabetes mellitus, high-grade asymptomatic left carotid artery stenosis and moderate stenosis of the right carotid artery, has been reluctant to having any surgical procedures for the same, had seen Dr. Trula Slade in the past, morbid obesity, and hyperlipidemia.  Patient is here for 6 month follow up for carotid stenosis and hypertension. Recently had carotid duplex performed and is here for results.  She has been doing well symptomatically, but does state that her and her husband are now separated, which has put significant stress on her. Dyspnea on exertion has been stable. Diabetes continues to be uncontrolled. Denies symptoms of neurologic deficits or visual disturbances. She states that her blood pressure has been very well controlled. She has lost significant amount of weight since last seen by me. No other specific complaints today. She is now followed by Dr. Joelyn Oms with nephrology.    Past Medical History:  Diagnosis Date  . Abnormal thyroid scan    decreased thyroid  . Actinomyces infection    h/o  . Anovulation    chronic  . Encounter for IUD insertion 09/15/2010 and 09/17/2005   Mirena  . Encounter for IUD insertion 06/02/1990   Paraguard  . H/O amenorrhea   . Hx of elevated lipids   . Hypertension   . Hypothyroidism   . Obesity   . Oligomenorrhea    h/o  . PCOS (polycystic ovarian syndrome)   . Type 2 diabetes mellitus ([place])      Past Surgical History:  Procedure Laterality Date  . PERIPHERAL VASCULAR CATHETERIZATION N/A 07/05/2015   Procedure: Renal Angiography;  Surgeon: Adrian Prows, MD;  Location: [facility];  Service: Cardiovascular;  Laterality: N/A;  . PERIPHERAL VASCULAR CATHETERIZATION Bilateral 07/05/2015   Procedure: Renal Intervention;  Surgeon: Adrian Prows, MD;  Location: [facility];  Service: Cardiovascular;  Laterality: Bilateral;  right and left renal stent placement  . PERIPHERAL VASCULAR CATHETERIZATION N/A 12/16/2015   Procedure: Renal Angiography;  Surgeon: Adrian Prows, MD;  Location: [facility];  Service: Cardiovascular;  Laterality: N/A;  . PERIPHERAL VASCULAR CATHETERIZATION Left 12/16/2015   Procedure: Carotid Angiography/Bilateral;  Surgeon: Adrian Prows, MD;  Location: [facility];  Service: Cardiovascular;  Laterality: Left;  . PERIPHERAL VASCULAR CATHETERIZATION  12/16/2015   Procedure: Peripheral Vascular Balloon Angioplasty;  Surgeon: Adrian Prows, MD;  Location: [facility];  Service: Cardiovascular;;  both sides  . THYROID EXPLORATION     PT STATES HAD PROCEDURE DONE ON THYROID  . WISDOM TOOTH EXTRACTION  1976    Family History  Problem Relation Age of Onset  . Hypertension Mother   . Diabetes Mother   . Diabetes Father   . Hypertension Maternal Grandmother   . Hypertension Paternal Grandmother     Social History   Socioeconomic History  . Marital status: Legally Separated    Spouse name: Not on file  . Number  of children: 2  . Years of education: Not on file  . Highest education level: Not on file  Occupational History  . Not on file  Social Needs  . Financial resource strain: Not on file  . Food insecurity:    Worry: Not on file    Inability: Not on file  . Transportation needs:    Medical: Not on file    Non-medical: Not on file  Tobacco Use  . Smoking status: Never Smoker  . Smokeless tobacco: Never Used  Substance and Sexual Activity   . Alcohol use: No  . Drug use: No  . Sexual activity: Yes    Birth control/protection: I.U.D.    Comment: MIRENA  Lifestyle  . Physical activity:    Days per week: Not on file    Minutes per session: Not on file  . Stress: Not on file  Relationships  . Social connections:    Talks on phone: Not on file    Gets together: Not on file    Attends religious service: Not on file    Active member of club or organization: Not on file    Attends meetings of clubs or organizations: Not on file    Relationship status: Not on file  . Intimate partner violence:    Fear of current or ex partner: Not on file    Emotionally abused: Not on file    Physically abused: Not on file    Forced sexual activity: Not on file  Other Topics Concern  . Not on file  Social History Narrative  . Not on file    Current Meds  Medication Sig  . amLODipine (NORVASC) 10 MG tablet Take 10 mg by mouth daily.  Marland Kitchen buPROPion (WELLBUTRIN SR) 150 MG 12 hr tablet bupropion HCl SR 150 mg tablet,12 hr sustained-release  . BYSTOLIC 20 MG TABS TK 1 T PO D  . chlorthalidone (HYGROTON) 25 MG tablet Take 25 mg by mouth daily.  . clopidogrel (PLAVIX) 75 MG tablet Take 1 tablet (75 mg total) by mouth daily with breakfast.  . ergocalciferol (VITAMIN D2) 50000 UNITS capsule Take 50,000 Units by mouth every Monday.   . Insulin Lispro Prot & Lispro (HUMALOG MIX 50/50 KWIKPEN) (50-50) 100 UNIT/ML Kwikpen 20 mLs. 20 units a.m. 15 units p.m.  Marland Kitchen levothyroxine (SYNTHROID, LEVOTHROID) 175 MCG tablet TK 1 T PO ONCE D  . Nebivolol HCl (BYSTOLIC PO) Take by mouth.  . olmesartan (BENICAR) 20 MG tablet TK 1 T PO  QD  . potassium chloride SA (K-DUR,KLOR-CON) 20 MEQ tablet Take 20 mEq by mouth daily.   . rosuvastatin (CRESTOR) 40 MG tablet Take 40 mg by mouth daily.   Current Facility-Administered Medications for the 12/26/18 encounter (Office Visit) with Adrian Prows, MD  Medication  . 0.9 %  sodium chloride infusion     Review of  Systems  Constitution: Positive for weight loss (intentional). Negative for decreased appetite, malaise/fatigue and weight gain.  Eyes: Negative for blurred vision and visual disturbance.  Cardiovascular: Positive for dyspnea on exertion (stable). Negative for chest pain, claudication, leg swelling, orthopnea, palpitations and syncope.  Respiratory: Negative for hemoptysis and wheezing.   Endocrine: Negative for cold intolerance and heat intolerance.  Hematologic/Lymphatic: Does not bruise/bleed easily.  Skin: Negative for nail changes.  Musculoskeletal: Negative for muscle weakness and myalgias.  Gastrointestinal: Negative for abdominal pain, change in bowel habit, nausea and vomiting.  Neurological: Negative for difficulty with concentration, dizziness, focal weakness and headaches.  Psychiatric/Behavioral:  Negative for altered mental status and suicidal ideas.  All other systems reviewed and are negative.      Objective:     Blood pressure (!) 142/72, pulse 62, height 5\' 5"  (1.651 m), weight 279 lb (126.6 kg), SpO2 99 %.  Echo- 05/31/2015 1. Left ventricle cavity is normal in size. Moderate concentric hypertrophy of the left ventricle. Normal global wall motion. Calculated EF 68%. 2. Mild mitral regurgitation. 3. Mild tricuspid regurgitation. No evidence of pulmonary hypertension.  Lexiscan myoview stress test 05/20/2015:  1. The resting electrocardiogram demonstrated normal sinus rhythm, incomplete RBBB and no resting arrhythmias.  Stress EKG is non-diagnostic for ischemia as it a pharmacologic stress using Lexiscan.  2. Myocardial perfusion imaging is normal. Overall left ventricular systolic function was normal without regional wall motion abnormalities. The left ventricular ejection fraction was 64%.  Renal artery duplex 04/24/2016: No evidence of renal artery occlusive disease in either renal artery. Study suggests patency of bilateral renal stents. Compared to 11/08/2015,  right renal artery restenosis no longer present. Normal intrarenal vascular perfusion is noted in both kidneys. Diffuse plaque noted in the abdominal aorta. Clinical correlation is suggested.  Carotid artery duplex 12/17/2018:  Stenosis in the right internal carotid artery (50-69%). Stenosis in the right common carotid artery (<50%). Severe stenosis in the left internal carotid artery (>=70%). The left PSV internal/common carotid artery ratio is 5.84, consistent with a stenosis of >70%. Stenosis in the left common carotid artery (>50%) with peak velocity of 502/137 cm/S. Stenosis in the left external carotid artery (>50%). Right vertebral artery waveform shows high resistance. Antegrade right vertebral artery flow. Antegrade left vertebral artery flow. There is severe diffuse plaque noted that appears mostly soft plaque through out the bilateral common caarotid and ICA with high risk for embolic complications with intervention. Compared to 06/13/18, ther eis progression of tthe left carotid stenosis. Consider surgical consult. Follow up in six months is appropriate if clinically indicated. See images.  Physical Exam  Constitutional: She is oriented to person, place, and time. Vital signs are normal. She appears well-developed and well-nourished.  HENT:  Head: Normocephalic and atraumatic.  Neck: Normal range of motion.  Cardiovascular: Normal rate, regular rhythm and intact distal pulses.  Murmur heard.  Early systolic murmur is present with a grade of 2/6 at the upper right sternal border and apex. Pulses:      Carotid pulses are on the right side with bruit and on the left side with bruit. Pulmonary/Chest: Effort normal and breath sounds normal. No accessory muscle usage. No respiratory distress.  Abdominal: Soft. Bowel sounds are normal.  Musculoskeletal: Normal range of motion.  Neurological: She is alert and oriented to person, place, and time.  Skin: Skin is warm and dry.   Vitals reviewed.  EKG 12/26/2018: Normal sinus rhythm at 61 bpm, left atrial enlargement, normal axis, IRBB, no evidence of ischemia.      Assessment & Recommendations:   Asymptomatic carotid artery stenosis, bilateral - Plan: EKG 12-Lead  Essential hypertension  CKD stage 4 due to type 2 diabetes mellitus ([place])  Uncontrolled type 2 diabetes mellitus with hyperglycemia ([facility])  Class 3 severe obesity due to excess calories with serious comorbidity and body mass index (BMI) of 45.0 to 49.9 in adult [facility])  Plan:  Carotid duplex results were discussed with the patient. Although has had slight progression of disease, in view of severe diffuse disease that appears to be mostly soft plaque, she is high risk for embolic complications with intervention.  I have recommended continued aggressive medical management. She is asymptomatic and is also reluctant to have surgery. Blood pressure has been much better controlled over the last several months, slightly elevated today; however, she is visibly upset in regards to her husband and suspect that this is the etiology. Will continue to monitor. No changes in medications today.   Diabetes continues to be uncontrolled, but she states is slightly better. She will continue to follow up with PCP regarding this. Reports CKD is stable and is now being followed by Dr. Carmina Miller.    She has had 20lb weight loss since last seen by me, I have congratulated her on this and provided positive reinforcement. I will see her back in 6 months with repeat carotid duplex at that time.    Jeri Lager, FNP-C [facility], [place] Office: [phone] Fax: [phone]

## 2018-12-27 NOTE — Progress Notes (Signed)
Please finalize

## 2018-12-28 ENCOUNTER — Encounter: Payer: Self-pay | Admitting: Cardiology

## 2019-01-02 DIAGNOSIS — I10 Essential (primary) hypertension: Secondary | ICD-10-CM | POA: Diagnosis not present

## 2019-01-02 DIAGNOSIS — E1165 Type 2 diabetes mellitus with hyperglycemia: Secondary | ICD-10-CM | POA: Diagnosis not present

## 2019-01-02 DIAGNOSIS — E7800 Pure hypercholesterolemia, unspecified: Secondary | ICD-10-CM | POA: Diagnosis not present

## 2019-01-12 DIAGNOSIS — N183 Chronic kidney disease, stage 3 (moderate): Secondary | ICD-10-CM | POA: Diagnosis not present

## 2019-01-23 DIAGNOSIS — N184 Chronic kidney disease, stage 4 (severe): Secondary | ICD-10-CM | POA: Diagnosis not present

## 2019-01-23 DIAGNOSIS — I10 Essential (primary) hypertension: Secondary | ICD-10-CM | POA: Diagnosis not present

## 2019-02-27 ENCOUNTER — Other Ambulatory Visit: Payer: Self-pay | Admitting: Cardiology

## 2019-03-02 DIAGNOSIS — N184 Chronic kidney disease, stage 4 (severe): Secondary | ICD-10-CM | POA: Diagnosis not present

## 2019-03-03 ENCOUNTER — Other Ambulatory Visit: Payer: Self-pay | Admitting: Cardiology

## 2019-03-03 DIAGNOSIS — N184 Chronic kidney disease, stage 4 (severe): Secondary | ICD-10-CM | POA: Diagnosis not present

## 2019-03-03 MED ORDER — AMLODIPINE BESYLATE 10 MG PO TABS: 10.0000 mg | ORAL_TABLET | Freq: Every day | ORAL | 1 refills | Status: DC

## 2019-03-04 ENCOUNTER — Other Ambulatory Visit: Payer: Self-pay | Admitting: Cardiology

## 2019-03-04 DIAGNOSIS — N184 Chronic kidney disease, stage 4 (severe): Secondary | ICD-10-CM | POA: Diagnosis not present

## 2019-03-04 DIAGNOSIS — I10 Essential (primary) hypertension: Secondary | ICD-10-CM | POA: Diagnosis not present

## 2019-03-05 NOTE — Telephone Encounter (Signed)
Please fill

## 2019-03-10 ENCOUNTER — Other Ambulatory Visit: Payer: Self-pay

## 2019-03-10 DIAGNOSIS — I701 Atherosclerosis of renal artery: Secondary | ICD-10-CM

## 2019-03-10 MED ORDER — BUPROPION HCL ER (SR) 150 MG PO TB12: ORAL_TABLET | ORAL | 3 refills | Status: DC

## 2019-03-27 ENCOUNTER — Ambulatory Visit: Payer: 59 | Admitting: Cardiology

## 2019-05-09 ENCOUNTER — Other Ambulatory Visit: Payer: Self-pay | Admitting: Cardiology

## 2019-05-20 ENCOUNTER — Other Ambulatory Visit: Payer: Self-pay

## 2019-05-20 ENCOUNTER — Ambulatory Visit (INDEPENDENT_AMBULATORY_CARE_PROVIDER_SITE_OTHER): Payer: 59

## 2019-05-20 DIAGNOSIS — I6523 Occlusion and stenosis of bilateral carotid arteries: Secondary | ICD-10-CM | POA: Diagnosis not present

## 2019-05-24 ENCOUNTER — Other Ambulatory Visit: Payer: Self-pay | Admitting: Cardiology

## 2019-05-24 DIAGNOSIS — I6523 Occlusion and stenosis of bilateral carotid arteries: Secondary | ICD-10-CM

## 2019-06-05 ENCOUNTER — Encounter: Payer: Self-pay | Admitting: Cardiology

## 2019-06-05 ENCOUNTER — Other Ambulatory Visit: Payer: Self-pay

## 2019-06-05 ENCOUNTER — Ambulatory Visit (INDEPENDENT_AMBULATORY_CARE_PROVIDER_SITE_OTHER): Payer: 59 | Admitting: Cardiology

## 2019-06-05 VITALS — BP 138/66 | HR 71 | Ht 63.0 in | Wt 254.0 lb

## 2019-06-05 DIAGNOSIS — E1122 Type 2 diabetes mellitus with diabetic chronic kidney disease: Secondary | ICD-10-CM | POA: Diagnosis not present

## 2019-06-05 DIAGNOSIS — E66813 Obesity, class 3: Secondary | ICD-10-CM

## 2019-06-05 DIAGNOSIS — I129 Hypertensive chronic kidney disease with stage 1 through stage 4 chronic kidney disease, or unspecified chronic kidney disease: Secondary | ICD-10-CM

## 2019-06-05 DIAGNOSIS — I6523 Occlusion and stenosis of bilateral carotid arteries: Secondary | ICD-10-CM

## 2019-06-05 DIAGNOSIS — Z6841 Body Mass Index (BMI) 40.0 and over, adult: Secondary | ICD-10-CM

## 2019-06-05 DIAGNOSIS — N184 Chronic kidney disease, stage 4 (severe): Secondary | ICD-10-CM

## 2019-06-05 DIAGNOSIS — I10 Essential (primary) hypertension: Secondary | ICD-10-CM

## 2019-06-05 NOTE — Progress Notes (Signed)
Subjective:   Julie [name], female    DOB: Feb 12, 1956, 63 y.o.   MRN: [id number]  Carol Ada, MD:  Chief Complaint  Patient presents with  . Hypertension  . Follow-up   HPI: Julie [name]  is a 63 y.o. female  with ong-standing history of difficult to control hypertension, has had bilateral renal artery stenting for high-grade bilateral renal artery stenosis in September 2016, stage IV chronic kidney disease secondary to uncontrolled diabetes mellitus, high-grade asymptomatic left carotid artery stenosis and moderate stenosis of the right carotid artery, has been reluctant to having any surgical procedures for the same, had seen Dr. Trula Slade in the past, morbid obesity, and hyperlipidemia.  Patient is here for 6 month follow up for carotid stenosis and hypertension. Recently had carotid duplex performed and is here for results.  She has been doing well symptomatically. Dyspnea on exertion has been stable. Diabetes continues to be uncontrolled. Denies symptoms of neurologic deficits or visual disturbances. She states that her blood pressure has been very well controlled. She has lost significant amount of weight since last seen by me. No other specific complaints today. She is now followed by Dr. Joelyn Oms with nephrology.   Past Medical History:  Diagnosis Date  . Abnormal thyroid scan    decreased thyroid  . Actinomyces infection    h/o  . Anovulation    chronic  . Encounter for IUD insertion 09/15/2010 and 09/17/2005   Mirena  . Encounter for IUD insertion 06/02/1990   Paraguard  . H/O amenorrhea   . Hx of elevated lipids   . Hypertension   . Hypothyroidism   . Obesity   . Oligomenorrhea    h/o  . PCOS (polycystic ovarian syndrome)   . Type 2 diabetes mellitus ([place])     Past Surgical History:  Procedure Laterality Date  . PERIPHERAL VASCULAR CATHETERIZATION N/A 07/05/2015   Procedure: Renal Angiography;  Surgeon: Adrian Prows, MD;  Location: [facility];   Service: Cardiovascular;  Laterality: N/A;  . PERIPHERAL VASCULAR CATHETERIZATION Bilateral 07/05/2015   Procedure: Renal Intervention;  Surgeon: Adrian Prows, MD;  Location: [facility];  Service: Cardiovascular;  Laterality: Bilateral;  right and left renal stent placement  . PERIPHERAL VASCULAR CATHETERIZATION N/A 12/16/2015   Procedure: Renal Angiography;  Surgeon: Adrian Prows, MD;  Location: [facility];  Service: Cardiovascular;  Laterality: N/A;  . PERIPHERAL VASCULAR CATHETERIZATION Left 12/16/2015   Procedure: Carotid Angiography/Bilateral;  Surgeon: Adrian Prows, MD;  Location: [facility];  Service: Cardiovascular;  Laterality: Left;  . PERIPHERAL VASCULAR CATHETERIZATION  12/16/2015   Procedure: Peripheral Vascular Balloon Angioplasty;  Surgeon: Adrian Prows, MD;  Location: [facility];  Service: Cardiovascular;;  both sides  . THYROID EXPLORATION     PT STATES HAD PROCEDURE DONE ON THYROID  . WISDOM TOOTH EXTRACTION  1976    Family History  Problem Relation Age of Onset  . Hypertension Mother   . Diabetes Mother   . Diabetes Father   . Hypertension Maternal Grandmother   . Hypertension Paternal Grandmother     Social History   Socioeconomic History  . Marital status: Legally Separated    Spouse name: Not on file  . Number of children: 2  . Years of education: Not on file  . Highest education level: Not on file  Occupational History  . Not on file  Social Needs  . Financial resource strain: Not on file  . Food insecurity  Worry: Not on file    Inability: Not on file  . Transportation needs    Medical: Not on file    Non-medical: Not on file  Tobacco Use  . Smoking status: Never Smoker  . Smokeless tobacco: Never Used  Substance and Sexual Activity  . Alcohol use: No  . Drug use: No  . Sexual activity: Yes    Birth control/protection: I.U.D.    Comment: MIRENA  Lifestyle  . Physical activity    Days per week: Not on file    Minutes per  session: Not on file  . Stress: Not on file  Relationships  . Social Herbalist on phone: Not on file    Gets together: Not on file    Attends religious service: Not on file    Active member of club or organization: Not on file    Attends meetings of clubs or organizations: Not on file    Relationship status: Not on file  . Intimate partner violence    Fear of current or ex partner: Not on file    Emotionally abused: Not on file    Physically abused: Not on file    Forced sexual activity: Not on file  Other Topics Concern  . Not on file  Social History Narrative  . Not on file    Current Meds  Medication Sig  . amLODipine (NORVASC) 10 MG tablet Take 1 tablet (10 mg total) by mouth daily.  Marland Kitchen aspirin EC 81 MG tablet Take 81 mg by mouth daily.  Marland Kitchen buPROPion (WELLBUTRIN SR) 150 MG 12 hr tablet One tablet two times a day (Patient taking differently: 150 mg daily. )  . BYSTOLIC 20 MG TABS TK 1 T PO D  . chlorthalidone (HYGROTON) 25 MG tablet Take 25 mg by mouth daily.  . cloNIDine (CATAPRES) 0.1 MG tablet Take 0.1 mg by mouth daily.  . clopidogrel (PLAVIX) 75 MG tablet TAKE 1 TABLET BY MOUTH DAILY  . ergocalciferol (VITAMIN D2) 50000 UNITS capsule Take 50,000 Units by mouth every Monday.   . gabapentin (NEURONTIN) 100 MG capsule Take 100 mg by mouth daily.  . Insulin Glargine (TOUJEO SOLOSTAR [place]) Inject into the skin. 15 units daily  . insulin lispro (HUMALOG) 100 UNIT/ML injection Inject into the skin. Take with a meal 5 units, 7 units, 7 units  . Levothyroxine Sodium 150 MCG CAPS Take 150 mcg by mouth daily.   . Nebivolol HCl (BYSTOLIC PO) Take 20 mg by mouth daily.   . potassium chloride SA (K-DUR) 20 MEQ tablet Take 20 mEq by mouth daily.  . rosuvastatin (CRESTOR) 40 MG tablet TAKE 1 TABLET BY MOUTH DAILY   Current Facility-Administered Medications for the 06/05/19 encounter (Office Visit) with Adrian Prows, MD  Medication  . 0.9 %  sodium chloride infusion   Review of  Systems  Constitution: Positive for weight loss (intentional). Negative for decreased appetite, malaise/fatigue and weight gain.  Eyes: Negative for blurred vision and visual disturbance.  Cardiovascular: Positive for dyspnea on exertion (stable). Negative for chest pain, claudication, leg swelling, orthopnea, palpitations and syncope.  Respiratory: Negative for hemoptysis and wheezing.   Endocrine: Negative for cold intolerance and heat intolerance.  Hematologic/Lymphatic: Does not bruise/bleed easily.  Skin: Negative for nail changes.  Musculoskeletal: Negative for muscle weakness and myalgias.  Gastrointestinal: Negative for abdominal pain, change in bowel habit, nausea and vomiting.  Neurological: Negative for difficulty with concentration, dizziness, focal weakness and headaches.  Psychiatric/Behavioral: Negative for  altered mental status and suicidal ideas.  All other systems reviewed and are negative.  Objective:   Blood pressure 138/66, pulse 71, height '5\' 3"'  (1.6 m), weight 254 lb (115.2 kg), SpO2 97 %. Body mass index is 44.99 kg/m.  Physical Exam  Constitutional: She is oriented to person, place, and time. Vital signs are normal. She appears well-developed and well-nourished.  HENT:  Head: Normocephalic and atraumatic.  Neck: Normal range of motion.  Cardiovascular: Normal rate, regular rhythm and intact distal pulses.  Murmur heard.  Early systolic murmur is present with a grade of 2/6 at the upper right sternal border and apex. Pulses:      Carotid pulses are on the right side with bruit and on the left side with bruit. Pulmonary/Chest: Effort normal and breath sounds normal. No accessory muscle usage. No respiratory distress.  Abdominal: Soft. Bowel sounds are normal.  Musculoskeletal: Normal range of motion.  Neurological: She is alert and oriented to person, place, and time.  Skin: Skin is warm and dry.  Vitals reviewed.  .Labs 05/04/2019: Serum glucose 188 mg, BUN  36, creatinine 3.12, eGFR 80.  Potassium 3.3.  CMP otherwise normal.  HB 11.5.  Cardiac studies: Echo- 05/31/2015 1. Left ventricle cavity is normal in size. Moderate concentric hypertrophy of the left ventricle. Normal global wall motion. Calculated EF 68%. 2. Mild mitral regurgitation. 3. Mild tricuspid regurgitation. No evidence of pulmonary hypertension.  Lexiscan myoview stress test 05/20/2015:  1. The resting electrocardiogram demonstrated normal sinus rhythm, incomplete RBBB and no resting arrhythmias.  Stress EKG is non-diagnostic for ischemia as it a pharmacologic stress using Lexiscan.  2. Myocardial perfusion imaging is normal. Overall left ventricular systolic function was normal without regional wall motion abnormalities. The left ventricular ejection fraction was 64%.  Renal artery duplex 04/24/2016: No evidence of renal artery occlusive disease in either renal artery. Study suggests patency of bilateral renal stents. Compared to 11/08/2015, right renal artery restenosis no longer present. Normal intrarenal vascular perfusion is noted in both kidneys. Diffuse plaque noted in the abdominal aorta. Clinical correlation is suggested.  Carotid artery duplex  05/20/2019: Stenosis in the right internal carotid artery (50-69%). Mild stenosis in the right external carotid artery (<50%). The right PSV internal/common carotid artery ratio of 4.24 is consistent with a stenosis of >70%. Stenosis in the right common carotid artery (>50%). Stenosis in the left internal carotid artery (50-69%). There is severe diffuse soft plaque noted  throughout the bilateral common carotid and ICA  with high risk for embolic complications with intervention. Right vertebral artery waveform shows high resistance and may suggest diffuse distal disease. Antegrade right vertebral artery flow. Antegrade left vertebral artery flow. Compared to 12/17/2018, left ICA stenosis was >70%.  Follow up in six months is  appropriate if clinically indicated.  Assessment & Recommendations:     ICD-10-CM   1. Essential hypertension  I10 EKG 12-Lead  2. Asymptomatic carotid artery stenosis, bilateral  I65.23   3. CKD stage 4 due to type 2 diabetes mellitus (HCC)  E11.22    N18.4   4. Class 3 severe obesity due to excess calories with serious comorbidity and body mass index (BMI) of 45.0 to 49.9 in adult [facility])  E66.01    Z68.42     EKG 12/26/2018: Normal sinus rhythm at 61 bpm, left atrial enlargement, normal axis, IRBB, no evidence of ischemia.   Plan:   Patient presents for up for a six-month office visit and follow-up of carotid artery stenosis,  difficult to control hypertension, obesity management.  Patient has lost significant amount of weight, since then she has noticed marked improvement in her blood pressure control.  She is occasionally has had dizziness, advised her that if she continues to lose weight, we should can discontinue clonidine.  I reviewed her labs from the nephrologist, she now has stage IV chronic kidney disease and she is concerned about going to dialysis.  I simply reassured her.  With regard to carotid artery stenosis, stenosis severity has reduced which I havel congratulated her for having made lifestyle changes.  I'd like to see her back in 6 months for follow-up.  Adrian Prows, MD, [facility] 06/05/2019, 2:29 PM [facility]. [place] Pager: [phone] Office: [phone] If no answer Cell [phone]

## 2019-06-07 ENCOUNTER — Encounter: Payer: Self-pay | Admitting: Cardiology

## 2019-06-15 ENCOUNTER — Other Ambulatory Visit: Payer: Self-pay | Admitting: Cardiology

## 2019-07-03 ENCOUNTER — Other Ambulatory Visit: Payer: Self-pay | Admitting: Cardiology

## 2019-10-29 ENCOUNTER — Other Ambulatory Visit: Payer: Self-pay | Admitting: Cardiology

## 2019-11-09 ENCOUNTER — Telehealth: Payer: Self-pay

## 2019-11-13 ENCOUNTER — Telehealth: Payer: Self-pay

## 2019-11-13 NOTE — Telephone Encounter (Signed)
Pt cannot afford her bystolic and she keeps getting samples from [place] and im not sure if you want to look into changing it or ?

## 2019-11-17 NOTE — Telephone Encounter (Signed)
Called patient to see if she has gotten a savings card; awaiting a call back

## 2019-11-30 ENCOUNTER — Ambulatory Visit (INDEPENDENT_AMBULATORY_CARE_PROVIDER_SITE_OTHER): Payer: 59

## 2019-11-30 ENCOUNTER — Other Ambulatory Visit: Payer: Self-pay

## 2019-11-30 DIAGNOSIS — I6523 Occlusion and stenosis of bilateral carotid arteries: Secondary | ICD-10-CM | POA: Diagnosis not present

## 2019-12-08 ENCOUNTER — Other Ambulatory Visit: Payer: Self-pay | Admitting: Cardiology

## 2019-12-08 DIAGNOSIS — I6523 Occlusion and stenosis of bilateral carotid arteries: Secondary | ICD-10-CM

## 2019-12-21 ENCOUNTER — Encounter: Payer: Self-pay | Admitting: Cardiology

## 2019-12-21 ENCOUNTER — Other Ambulatory Visit: Payer: Self-pay

## 2019-12-21 ENCOUNTER — Ambulatory Visit: Payer: 59 | Admitting: Cardiology

## 2019-12-21 VITALS — BP 162/63 | Temp 97.4°F | Resp 16 | Ht 63.0 in | Wt 248.2 lb

## 2019-12-21 DIAGNOSIS — N184 Chronic kidney disease, stage 4 (severe): Secondary | ICD-10-CM

## 2019-12-21 DIAGNOSIS — I10 Essential (primary) hypertension: Secondary | ICD-10-CM

## 2019-12-21 DIAGNOSIS — E1122 Type 2 diabetes mellitus with diabetic chronic kidney disease: Secondary | ICD-10-CM

## 2019-12-21 DIAGNOSIS — I6523 Occlusion and stenosis of bilateral carotid arteries: Secondary | ICD-10-CM

## 2019-12-21 DIAGNOSIS — Z6841 Body Mass Index (BMI) 40.0 and over, adult: Secondary | ICD-10-CM

## 2019-12-21 MED ORDER — CARVEDILOL 25 MG PO TABS: 25.0000 mg | ORAL_TABLET | Freq: Two times a day (BID) | ORAL | 3 refills | Status: DC

## 2019-12-21 NOTE — Progress Notes (Signed)
Primary Physician/Referring:  Carol Ada, MD  Julie [name] ID: Julie Julie [name], female    DOB: December 12, 1955, 64 y.o.   MRN: [id number]  Chief Complaint  Julie [name] presents with  . Hypertension  . Asymptomatic Carotid Artery Stenosis    6 month follow up  . Dizziness    after standing   HPI:    Julie Julie [name]  is a 64 y.o. AAF Julie [name] with severe anxiety, long-standing history of difficult to control hypertension, orthostatic hypotension due to diabetic autonomic insufficiency, bilateral renal artery stenting for high-grade bilateral renal artery stenosis in September 2016, stage III-IV chronic kidney disease secondary to uncontrolled diabetes mellitus, high-grade asymptomatic left carotid artery stenosis and moderate stenosis of the right carotid artery, has been reluctant to having any surgical procedures for the same, had seen Dr. Trula Slade in the past, obesity, and hyperlipidemia.  She presents for a 63-monthoffice visit, states that she is doing well, has been trying her best to lose weight and states that her renal function is improved by recent labs and her diabetes continues to be in good control as well.  Has mild chronic dyspnea.  This is chronic.  No chest pain, no palpitations, no symptoms to suggest TIA or claudication.  Past Medical History:  Diagnosis Date  . Abnormal thyroid scan    decreased thyroid  . Actinomyces infection    h/o  . Anovulation    chronic  . Encounter for IUD insertion 09/15/2010 and 09/17/2005   Mirena  . Encounter for IUD insertion 06/02/1990   Paraguard  . H/O amenorrhea   . Hx of elevated lipids   . Hypertension   . Hypothyroidism   . Obesity   . Oligomenorrhea    h/o  . PCOS (polycystic ovarian syndrome)   . Type 2 diabetes mellitus ([place]    Past Surgical History:  Procedure Laterality Date  . PERIPHERAL VASCULAR CATHETERIZATION N/A 07/05/2015   Procedure: Renal Angiography;  Surgeon: JAdrian Prows MD;  [facility];  Service:  Cardiovascular;  Laterality: N/A;  . PERIPHERAL VASCULAR CATHETERIZATION Bilateral 07/05/2015   Procedure: Renal Intervention;  Surgeon: JAdrian Prows MD;  [facility];  Service: Cardiovascular;  Laterality: Bilateral;  right and left renal stent placement  . PERIPHERAL VASCULAR CATHETERIZATION N/A 12/16/2015   Procedure: Renal Angiography;  Surgeon: JAdrian Prows MD;  [facility];  Service: Cardiovascular;  Laterality: N/A;  . PERIPHERAL VASCULAR CATHETERIZATION Left 12/16/2015   Procedure: Carotid Angiography/Bilateral;  Surgeon: JAdrian Prows MD;  [facility];  Service: Cardiovascular;  Laterality: Left;  . PERIPHERAL VASCULAR CATHETERIZATION  12/16/2015   Procedure: Peripheral Vascular Balloon Angioplasty;  Surgeon: JAdrian Prows MD;  [facility];  Service: Cardiovascular;;  both sides  . THYROID EXPLORATION     PT STATES HAD PROCEDURE DONE ON THYROID  . WISDOM TOOTH EXTRACTION  1976   Family History  Problem Relation Age of Onset  . Hypertension Mother   . Diabetes Mother   . Diabetes Father   . Hypertension Maternal Grandmother   . Hypertension Paternal Grandmother     Social History   Tobacco Use  . Smoking status: Never Smoker  . Smokeless tobacco: Never Used  Substance Use Topics  . Alcohol use: No   ROS  Review of Systems  Constitution: Positive for weight loss (intentional).  Cardiovascular: Positive for dyspnea on exertion (chronic). Negative for leg swelling.  Gastrointestinal: Negative for melena.  Psychiatric/Behavioral: The  Julie [name] is nervous/anxious.    Objective  Blood pressure (!) 162/63, temperature (!) 97.4 F (36.3 C), temperature source Temporal, resp. rate 16, height '5\' 3"'  (1.6 m), weight 248 lb 3.2 oz (112.6 kg), SpO2 100 %.  Vitals with BMI 12/21/2019 06/05/2019 12/26/2018  Height '5\' 3"'  '5\' 3"'  '5\' 5"'   Weight 248 lbs 3 oz 254 lbs 279 lbs  BMI 43.98 00.86 76.19  Systolic 509 326 712  Diastolic 63 66 72   Pulse - 71 62    Flowsheets      12/21/19 4:15 PM  12/21/19 4:28 PM  12/21/19 4:29 PM   Orthostatic BP  159/72   162/63   144/64    BP Location  Left Arm   Left Arm   Left Arm    Julie [name] Position  Supine   Sitting   Standing    Cuff Size  Large   Large   Large    Orthostatic Pulse  72   73   74    Resp  16      Temp  97.4 F (36.3 C)      Temp src  Temporal      SpO2  100%      Weight  248 lb 3.2 oz (112.6 kg)      Height  '5\' 3"'  (1.6 m)         Physical Exam  Constitutional: Vital signs are normal. She appears well-developed and well-nourished.  Cardiovascular: Normal rate, regular rhythm and intact distal pulses.  Murmur heard.  Early systolic murmur is present with a grade of 2/6 at the upper right sternal border and apex. Pulses:      Carotid pulses are on the right side with bruit and on the left side with bruit.      Dorsalis pedis pulses are 2+ on the right side and 2+ on the left side.       Posterior tibial pulses are 2+ on the right side and 2+ on the left side.  No JVD, No leg edema.   Pulmonary/Chest: Effort normal and breath sounds normal. No accessory muscle usage. No respiratory distress.  Abdominal: Soft. Bowel sounds are normal.  Vitals reviewed.  Laboratory examination:   No results for input(s): NA, K, CL, CO2, GLUCOSE, BUN, CREATININE, CALCIUM, GFRNONAA, GFRAA in the last 8760 hours. CrCl cannot be calculated (Julie [name] most recent lab result is older than the maximum 21 days allowed.).  CMP Latest Ref Rng & Units 12/16/2015 12/15/2015 10/13/2015  Glucose 65 - 99 mg/dL 247(H) 351(H) -  BUN 6 - 20 mg/dL 21(H) 24(H) -  Creatinine 0.44 - 1.00 mg/dL 2.18(H) 2.31(H) 2.12(H)  Sodium 135 - 145 mmol/L 140 136 -  Potassium 3.5 - 5.1 mmol/L 2.8(L) 2.9(L) -  Chloride 101 - 111 mmol/L 101 96(L) -  CO2 22 - 32 mmol/L 29 26 -  Calcium 8.9 - 10.3 mg/dL 9.4 9.8 -   CBC Latest Ref Rng & Units 12/16/2015 07/06/2015 07/05/2015  WBC 4.0 - 10.5 K/uL 6.1 9.4 9.2  Hemoglobin 12.0 -  15.0 g/dL 11.9(L) 11.6(L) 12.7  Hematocrit 36.0 - 46.0 % 37.1 36.1 39.0  Platelets 150 - 400 K/uL 138(L) 152 174   Lipid Panel  No results found for: CHOL, TRIG, HDL, CHOLHDL, VLDL, LDLCALC, LDLDIRECT HEMOGLOBIN A1C Lab Results  Component Value Date   HGBA1C 12.2 (H) 12/15/2015   MPG 303 12/15/2015   TSH No results for input(s): TSH in the last 8760 hours.  External labs  10/01/2019:   Cholesterol, total 128.000 M 10/01/2019 HDL 50.000 M 10/01/2019 LDL 79.000 02/24/2018 Triglycerides 73.000 10/01/2019  A1C 7.500 % 10/01/2019; TSH 0.797 10/01/2019  Hemoglobin 11.000 G/ 08/26/2019 Creatinine, Serum 2.530 MG/ 10/06/2019 Potassium 3.600 02/24/2018 Magnesium N/D ALT (SGPT) 50.000 IU/ 10/01/2019  Labs 05/04/2019: Serum glucose 188 mg, BUN 36, creatinine 3.12, eGFR 80.  Potassium 3.3.  CMP otherwise normal.  HB 11.5.  Medications and allergies   Allergies  Allergen Reactions  . Sulfa Antibiotics Rash and Other (See Comments)    Wheezing     Current Outpatient Medications  Medication Instructions  . amLODipine (NORVASC) 10 mg, Oral, Daily  . aspirin EC 81 mg, Oral, Daily  . buPROPion (WELLBUTRIN SR) 150 MG 12 hr tablet One tablet two times a day  . carvedilol (COREG) 25 mg, Oral, 2 times daily with meals  . chlorthalidone (HYGROTON) 25 MG tablet TAKE 1 TABLET BY MOUTH EVERY MORNING  . cloNIDine (CATAPRES) 0.1 mg, Oral, Daily  . ergocalciferol (VITAMIN D2) 50,000 Units, Oral, Every Mon  . hydrALAZINE (APRESOLINE) 100 mg, Oral, 2 times daily  . insulin lispro (HUMALOG) 100 UNIT/ML injection Subcutaneous, Take with a meal 5 units, 7 units, 7 units   . Levothyroxine Sodium 150 mcg, Oral, Daily  . potassium chloride SA (K-DUR) 20 MEQ tablet 20 mEq, Oral, Daily  . rosuvastatin (CRESTOR) 40 MG tablet TAKE 1 TABLET BY MOUTH DAILY   Radiology:   Ultrasound of the kidneys 07/25/2018: No acute abnormality in the kidneys.  Cardiac Studies:   Echo- 05/31/2015 1. Left ventricle cavity is  normal in size. Moderate concentric hypertrophy of the left ventricle. Normal global wall motion. Calculated EF 68%. 2. Mild mitral regurgitation. 3. Mild tricuspid regurgitation. No evidence of pulmonary hypertension.  Lexiscan myoview stress test 05/20/2015:  1. The resting electrocardiogram demonstrated normal sinus rhythm, incomplete RBBB and no resting arrhythmias.  Stress EKG is non-diagnostic for ischemia as it a pharmacologic stress using Lexiscan.  2. Myocardial perfusion imaging is normal. Overall left ventricular systolic function was normal without regional wall motion abnormalities. The left ventricular ejection fraction was 64%.  Renal artery duplex 04/24/2016: No evidence of renal artery occlusive disease in either renal artery. Study suggests patency of bilateral renal stents. Compared to 11/08/2015, right renal artery restenosis no longer present. Normal intrarenal vascular perfusion is noted in both kidneys. Diffuse plaque noted in the abdominal aorta. Clinical correlation is suggested.  Carotid artery duplex 11/30/2019:  Doppler velocity suggests stenosis in the right internal carotid artery  (>=70%). The right PSV internal/common carotid artery ratio of 5.5 is  consistent with a stenosis of >70%.  Doppler velocity suggests stenosis in the right common carotid artery  (<50%).  Doppler velocity suggests stenosis in the left internal carotid artery  (>=70%). The left PSV internal/common carotid artery ratio of 3.49 is  consistent with a stenosis of >70%.  Doppler velocity suggests stenosis in the left common carotid artery  (>50%). Doppler velocity suggests stenosis in the left external carotid  artery (<50%).  Antegrade right vertebral artery flow. Antegrade left vertebral artery  flow.  Follow up in six months is appropriate if clinically indicated. Compared  to 04/20/2019, mild progression of disease bilaterally.  EKG 12/21/2019: Normal sinus rhythm at rate of 69 bpm,  left atrial enlargement, normal axis.  No evidence of ischemia, otherwise normal EKG.   No significant change from  EKG 12/26/2018.  Assessment     ICD-10-CM   1. Essential hypertension  I10 EKG 12-Lead  carvedilol (COREG) 25 MG tablet  2. Asymptomatic carotid artery stenosis, bilateral  I65.23   3. CKD stage 4 due to type 2 diabetes mellitus (HCC)  E11.22    N18.4   4. Class 3 severe obesity due to excess calories with serious comorbidity and body mass index (BMI) of 45.0 to 49.9 in adult [facility])  E66.01    Z68.42      Meds ordered this encounter  Medications  . carvedilol (COREG) 25 MG tablet    Sig: Take 1 tablet (25 mg total) by mouth 2 (two) times daily with a meal.    Dispense:  180 tablet    Refill:  3    Medications Discontinued During This Encounter  Medication Reason  . BYSTOLIC 20 MG TABS Change in therapy  . gabapentin (NEURONTIN) 100 MG capsule Error  . Insulin Glargine (TOUJEO SOLOSTAR [place]) Error  . Nebivolol HCl (BYSTOLIC PO) Error  . clopidogrel (PLAVIX) 75 MG tablet Error  . 0.9 %  sodium chloride infusion   . BYSTOLIC 20 MG TABS Change in therapy  . carvedilol (COREG) 12.5 MG tablet Reorder    Recommendations:   CADINCE HILSCHER  is a 64 y.o. AAF Julie [name] with severe anxiety, long-standing history of difficult to control hypertension, orthostatic hypotension due to diabetic autonomic insufficiency, bilateral renal artery stenting for high-grade bilateral renal artery stenosis in September 2016, stage III-IV chronic kidney disease secondary to uncontrolled diabetes mellitus, high-grade asymptomatic left carotid artery stenosis and moderate stenosis of the right carotid artery, has been reluctant to having any surgical procedures for the same, had seen Dr. Trula Slade in the past, obesity, and hyperlipidemia.  Blood pressure is marginally elevated today, but she is also extremely anxious.  Increased carvedilol to 25 mg p.o. twice daily.  Lipids are well controlled,  diabetes is improving as well and she has started to lose weight with aggressive dieting.  With regard to carotid stenosis, we will continue surveillance, hopefully with diabetes control, cholesterol control and hypertension control she would be a candidate for improved outcomes with carotid revascularization.  I am concerned that in view of diffuse disease including involvement of common carotid artery, she has extreme high risk for cardioembolic phenomena.  Hence medical therapy for now.  With regard to renal disease, EGFR is improved, recent labs reviewed.  Adrian Prows, MD, [facility] 12/26/2019, 2:44 PM [facility] [place] Office: [phone]

## 2019-12-24 ENCOUNTER — Telehealth: Payer: Self-pay

## 2019-12-24 NOTE — Telephone Encounter (Signed)
Called patient, NA LMAM to call back to confirm if she is taking Insulin or not.

## 2019-12-24 NOTE — Telephone Encounter (Signed)
-----   Message from Adrian Prows, MD sent at 12/22/2019  7:12 AM EST ----- Regarding: Medications Is she not taking insulin.   JG

## 2019-12-25 NOTE — Telephone Encounter (Signed)
2nd attempt : Called patient, NA, LMAM

## 2020-01-03 ENCOUNTER — Other Ambulatory Visit: Payer: Self-pay | Admitting: Cardiology

## 2020-01-08 ENCOUNTER — Telehealth: Payer: Self-pay

## 2020-01-08 NOTE — Telephone Encounter (Signed)
Patient returning my call from Feb 25 and she stated that she is NOT on insulin at all.

## 2020-02-07 ENCOUNTER — Other Ambulatory Visit: Payer: Self-pay | Admitting: Cardiology

## 2020-02-07 DIAGNOSIS — I701 Atherosclerosis of renal artery: Secondary | ICD-10-CM

## 2020-02-15 ENCOUNTER — Other Ambulatory Visit: Payer: Self-pay | Admitting: Cardiology

## 2020-02-15 NOTE — Telephone Encounter (Signed)
Please ask if she is taking, it was not on her list last visit

## 2020-02-15 NOTE — Telephone Encounter (Signed)
You can refill this then, will address on next OV. 9 day supply without refill. To bring all meds

## 2020-03-26 ENCOUNTER — Other Ambulatory Visit: Payer: Self-pay | Admitting: Cardiology

## 2020-06-02 ENCOUNTER — Ambulatory Visit: Payer: BC Managed Care – PPO

## 2020-06-02 DIAGNOSIS — I6523 Occlusion and stenosis of bilateral carotid arteries: Secondary | ICD-10-CM

## 2020-06-05 ENCOUNTER — Other Ambulatory Visit: Payer: Self-pay | Admitting: Cardiology

## 2020-06-05 DIAGNOSIS — I6523 Occlusion and stenosis of bilateral carotid arteries: Secondary | ICD-10-CM

## 2020-06-07 ENCOUNTER — Other Ambulatory Visit: Payer: 59

## 2020-06-20 ENCOUNTER — Ambulatory Visit: Payer: 59 | Admitting: Cardiology

## 2020-06-20 ENCOUNTER — Encounter: Payer: Self-pay | Admitting: Cardiology

## 2020-06-20 ENCOUNTER — Other Ambulatory Visit: Payer: Self-pay

## 2020-06-20 VITALS — BP 131/50 | HR 71 | Resp 16 | Ht 63.0 in | Wt 241.8 lb

## 2020-06-20 DIAGNOSIS — N184 Chronic kidney disease, stage 4 (severe): Secondary | ICD-10-CM

## 2020-06-20 DIAGNOSIS — E7800 Pure hypercholesterolemia, unspecified: Secondary | ICD-10-CM

## 2020-06-20 DIAGNOSIS — I10 Essential (primary) hypertension: Secondary | ICD-10-CM

## 2020-06-20 DIAGNOSIS — I6523 Occlusion and stenosis of bilateral carotid arteries: Secondary | ICD-10-CM

## 2020-06-20 MED ORDER — EZETIMIBE 10 MG PO TABS: 10.0000 mg | ORAL_TABLET | Freq: Every day | ORAL | 3 refills | Status: DC

## 2020-06-20 NOTE — Progress Notes (Signed)
Primary Physician/Referring:  Carol Ada, MD  Patient ID: Julie [name], female    DOB: 1956/04/03, 64 y.o.   MRN: [id number]  Chief Complaint  Patient presents with  . Hypertension  . Carotid Stenosis  . Hyperlipidemia  . Follow-up    6 month   HPI:    Julie [name]  is a 64 y.o. AAF patient with severe anxiety, long-standing history of difficult to control hypertension, orthostatic hypotension due to diabetic autonomic insufficiency, bilateral renal artery stenting for high-grade bilateral renal artery stenosis in September 2016, stage III-IV chronic kidney disease secondary to uncontrolled diabetes mellitus, high-grade asymptomatic left carotid artery stenosis and moderate stenosis of the right carotid artery, has been reluctant to having any surgical procedures for the same, had seen Dr. Trula Slade in the past, obesity, and hyperlipidemia.  Patient presents for 6 month follow up on hypertension, hyperlipidemia, diabetes mellitus, and bilateral carotid stenosis. At last visit increased carvedilol to $RemoveBefor'25mg'gCLSTRziRHFh$  BID. She has tolerated the increase well. Reports she checks her blood pressure regularly at home and averages 956-387 systolic and 56-43 diastolic. Denies chest pain, palpitations, claudication, shortness of breath, orthopnea. No symptoms suggestive of TIA or stroke. Patient states she has continued to focus on improving her diet and making positive lifestyle changes. Of note patient appears anxious today when discussing her medical conditions.    Past Medical History:  Diagnosis Date  . Abnormal thyroid scan    decreased thyroid  . Actinomyces infection    h/o  . Anovulation    chronic  . Encounter for IUD insertion 09/15/2010 and 09/17/2005   Mirena  . Encounter for IUD insertion 06/02/1990   Paraguard  . H/O amenorrhea   . Hx of elevated lipids   . Hypertension   . Hypothyroidism   . Obesity   . Oligomenorrhea    h/o  . PCOS (polycystic ovarian syndrome)   . Type 2  diabetes mellitus ([place])    Past Surgical History:  Procedure Laterality Date  . PERIPHERAL VASCULAR CATHETERIZATION N/A 07/05/2015   Procedure: Renal Angiography;  Surgeon: Adrian Prows, MD;  Location: [facility];  Service: Cardiovascular;  Laterality: N/A;  . PERIPHERAL VASCULAR CATHETERIZATION Bilateral 07/05/2015   Procedure: Renal Intervention;  Surgeon: Adrian Prows, MD;  Location: [facility];  Service: Cardiovascular;  Laterality: Bilateral;  right and left renal stent placement  . PERIPHERAL VASCULAR CATHETERIZATION N/A 12/16/2015   Procedure: Renal Angiography;  Surgeon: Adrian Prows, MD;  Location: [facility];  Service: Cardiovascular;  Laterality: N/A;  . PERIPHERAL VASCULAR CATHETERIZATION Left 12/16/2015   Procedure: Carotid Angiography/Bilateral;  Surgeon: Adrian Prows, MD;  Location: [facility];  Service: Cardiovascular;  Laterality: Left;  . PERIPHERAL VASCULAR CATHETERIZATION  12/16/2015   Procedure: Peripheral Vascular Balloon Angioplasty;  Surgeon: Adrian Prows, MD;  Location: [facility];  Service: Cardiovascular;;  both sides  . THYROID EXPLORATION     PT STATES HAD PROCEDURE DONE ON THYROID  . WISDOM TOOTH EXTRACTION  1976   Family History  Problem Relation Age of Onset  . Hypertension Mother   . Diabetes Mother   . Diabetes Father   . Hypertension Maternal Grandmother   . Hypertension Paternal Grandmother     Social History   Tobacco Use  . Smoking status: Never Smoker  . Smokeless tobacco: Never Used  Substance Use Topics  . Alcohol use: No   ROS  Review of Systems  Constitutional: Positive for weight loss (intentional).  Cardiovascular: Positive for dyspnea on exertion (chronic, stable). Negative for chest pain, claudication, leg swelling, palpitations, paroxysmal nocturnal dyspnea and syncope.  Gastrointestinal: Negative for melena.  Psychiatric/Behavioral: The patient is nervous/anxious.    Objective  Blood pressure (!) 131/50,  pulse 71, resp. rate 16, height $RemoveBe'5\' 3"'APYSPMuYp$  (1.6 m), weight 241 lb 12.8 oz (109.7 kg), SpO2 100 %.  Vitals with BMI 06/20/2020 06/20/2020 12/21/2019  Height - $Remove'5\' 3"'Alzyyhu$  $RemoveB'5\' 3"'UdCPsjnx$   Weight - 241 lbs 13 oz 248 lbs 3 oz  BMI - 05.39 76.73  Systolic 419 379 024  Diastolic 50 37 63  Pulse 71 74 -   Physical Exam Vitals reviewed.  Constitutional:      Appearance: She is well-developed.  Cardiovascular:     Rate and Rhythm: Normal rate and regular rhythm.     Pulses: Intact distal pulses.          Carotid pulses are on the right side with bruit and on the left side with bruit.      Dorsalis pedis pulses are 1+ on the right side and 1+ on the left side.       Posterior tibial pulses are 1+ on the right side and 1+ on the left side.     Heart sounds: Murmur heard.  Early systolic murmur is present with a grade of 2/6 at the upper right sternal border and apex.      Comments: No JVD, No leg edema.  Pulmonary:     Effort: Pulmonary effort is normal. No accessory muscle usage or respiratory distress.     Breath sounds: Normal breath sounds.  Abdominal:     General: Bowel sounds are normal.     Palpations: Abdomen is soft.     Comments: obese    Laboratory examination:   No results for input(s): NA, K, CL, CO2, GLUCOSE, BUN, CREATININE, CALCIUM, GFRNONAA, GFRAA in the last 8760 hours. CrCl cannot be calculated (Patient's most recent lab result is older than the maximum 21 days allowed.).  CMP Latest Ref Rng & Units 12/16/2015 12/15/2015 10/13/2015  Glucose 65 - 99 mg/dL 247(H) 351(H) -  BUN 6 - 20 mg/dL 21(H) 24(H) -  Creatinine 0.44 - 1.00 mg/dL 2.18(H) 2.31(H) 2.12(H)  Sodium 135 - 145 mmol/L 140 136 -  Potassium 3.5 - 5.1 mmol/L 2.8(L) 2.9(L) -  Chloride 101 - 111 mmol/L 101 96(L) -  CO2 22 - 32 mmol/L 29 26 -  Calcium 8.9 - 10.3 mg/dL 9.4 9.8 -   CBC Latest Ref Rng & Units 12/16/2015 07/06/2015 07/05/2015  WBC 4.0 - 10.5 K/uL 6.1 9.4 9.2  Hemoglobin 12.0 - 15.0 g/dL 11.9(L) 11.6(L) 12.7  Hematocrit 36  - 46 % 37.1 36.1 39.0  Platelets 150 - 400 K/uL 138(L) 152 174   Lipid Panel  No results found for: CHOL, TRIG, HDL, CHOLHDL, VLDL, LDLCALC, LDLDIRECT HEMOGLOBIN A1C Lab Results  Component Value Date   HGBA1C 12.2 (H) 12/15/2015   MPG 303 12/15/2015   TSH No results for input(s): TSH in the last 8760 hours.  External labs:  04/13/2020 LDL-C 76.000 Triglycerides 80.000  TSH 0.171  Cholesterol, total 137.000 M 02/05/20 HDL 50.000 MG 10/01/19   A1C 6.900 02/05/2020 TSH 0.171 04/13/2020  12/03/2019 Glucose Random 174.000 M  BUN 34.000 MG  Creatinine, Serum 2.140 MG CrCl Est 23.48  eGFR 80  05/04/2019   Medications and allergies   Allergies  Allergen Reactions  . Sulfa Antibiotics Rash and Other (See Comments)    Wheezing  Current Outpatient Medications  Medication Instructions  . amLODipine (NORVASC) 10 MG tablet TAKE 1 TABLET BY MOUTH DAILY  . aspirin EC 81 mg, Oral, Daily  . buPROPion (WELLBUTRIN SR) 150 MG 12 hr tablet TAKE 1 TABLET BY MOUTH TWICE DAILY  . carvedilol (COREG) 25 mg, Oral, 2 times daily with meals  . chlorthalidone (HYGROTON) 25 MG tablet TAKE 1 TABLET BY MOUTH EVERY MORNING  . cloNIDine (CATAPRES) 0.1 mg, Oral, Daily  . clopidogrel (PLAVIX) 75 MG tablet TAKE 1 TABLET BY MOUTH DAILY  . ergocalciferol (VITAMIN D2) 50,000 Units, Oral, Every Mon  . ezetimibe (ZETIA) 10 mg, Oral, Daily  . hydrALAZINE (APRESOLINE) 100 mg, Oral, 2 times daily  . insulin lispro (HUMALOG) 100 UNIT/ML injection Subcutaneous, Take with a meal 5 units, 7 units, 7 units   . Levothyroxine Sodium 150 mcg, Oral, Daily  . Potassium Chloride ER 20 MEQ TBCR 1 tablet, Oral, 2 times daily  . potassium chloride SA (K-DUR) 20 MEQ tablet 20 mEq, Oral, Daily  . rosuvastatin (CRESTOR) 40 MG tablet TAKE 1 TABLET BY MOUTH DAILY   Radiology:   Ultrasound of the kidneys 07/25/2018: No acute abnormality in the kidneys.  Cardiac Studies:   Echo- 05/31/2015 1. Left ventricle cavity is  normal in size. Moderate concentric hypertrophy of the left ventricle. Normal global wall motion. Calculated EF 68%. 2. Mild mitral regurgitation. 3. Mild tricuspid regurgitation. No evidence of pulmonary hypertension.  Lexiscan myoview stress test 05/20/2015:  1. The resting electrocardiogram demonstrated normal sinus rhythm, incomplete RBBB and no resting arrhythmias.  Stress EKG is non-diagnostic for ischemia as it a pharmacologic stress using Lexiscan.  2. Myocardial perfusion imaging is normal. Overall left ventricular systolic function was normal without regional wall motion abnormalities. The left ventricular ejection fraction was 64%.  Renal artery duplex 04/24/2016: No evidence of renal artery occlusive disease in either renal artery. Study suggests patency of bilateral renal stents. Compared to 11/08/2015, right renal artery restenosis no longer present. Normal intrarenal vascular perfusion is noted in both kidneys. Diffuse plaque noted in the abdominal aorta. Clinical correlation is suggested.  Carotid artery duplex 06/02/2020:  Stenosis in the right internal carotid artery (>=70%). The right PSV internal/common carotid artery ratio of 5.16 is consistent with a stenosis of >70%. Stenosis in the right common carotid artery (<50%).  Stenosis in the left internal carotid artery (50-69%). Stenosis in the left common carotid artery (>50%). Stenosis in the left external carotid artery (<50%).  Antegrade right vertebral artery flow. Antegrade left vertebral artery flow.  Compared to 11/30/2019, no significant change. There is severe diffuse plaque throughout the bilateral common carotid arteries as well with high risk for embolic complications with invasive approach.  Follow up in six months is appropriate if clinically indicated.  EKG:  EKG 06/20/2020: Normal sinus rhythm at 71 bpm. Normal axis. Left atrial enlargement. No significant change from EKG 12/21/2019  Assessment      ICD-10-CM   1. Essential hypertension  I10 EKG 12-Lead  2. Asymptomatic carotid artery stenosis, bilateral  I65.23   3. CKD stage 4 due to type 2 diabetes mellitus (HCC)  E11.22    N18.4   4. Hypercholesteremia  E78.00 ezetimibe (ZETIA) 10 MG tablet    Lipid Panel With LDL/HDL Ratio    Lipid Panel With LDL/HDL Ratio     Meds ordered this encounter  Medications  . ezetimibe (ZETIA) 10 MG tablet    Sig: Take 1 tablet (10 mg total) by mouth daily.  Dispense:  90 tablet    Refill:  3    There are no discontinued medications.  Recommendations:   Julie [name]  is a 64 y.o. AAF patient with severe anxiety, long-standing history of difficult to control hypertension, orthostatic hypotension due to diabetic autonomic insufficiency, bilateral renal artery stenting for high-grade bilateral renal artery stenosis in September 2016, stage III-IV chronic kidney disease secondary to uncontrolled diabetes mellitus, high-grade asymptomatic left carotid artery stenosis and moderate stenosis of the right carotid artery, has been reluctant to having any surgical procedures for the same, had seen Dr. Trula Slade in the past, obesity, and hyperlipidemia.  Reviewed external labs. Diabetes is well controlled and managed by PCP. Lipids are still slightly above goal with LDL at 76, goal is <70. Will add Zetia and recheck lipids in 4-6 weeks. Blood pressure is well controlled with increased dose of Carvedilol. Reviewed recent carotid duplex results. Lesions are stable, but in view of diffuse disease she is high risk for cardioembolic phenomenon. Will continue medical management of risk factors and regular surveillance. Encouraged patient to continue lifestyle changes to control cardiovascular risk factors, she has been gradually loosing weight.   Will follow up in 6 months. Ordered carotid duplex to be done before next visit.    Adrian Prows, MD, [facility] 06/20/2020, [place] [facility] Office: [phone]

## 2020-06-24 ENCOUNTER — Other Ambulatory Visit: Payer: Self-pay | Admitting: Cardiology

## 2020-06-30 ENCOUNTER — Other Ambulatory Visit: Payer: Self-pay | Admitting: Cardiology

## 2020-07-01 ENCOUNTER — Other Ambulatory Visit: Payer: BC Managed Care – PPO

## 2020-07-01 ENCOUNTER — Other Ambulatory Visit: Payer: Self-pay

## 2020-07-01 DIAGNOSIS — Z20822 Contact with and (suspected) exposure to covid-19: Secondary | ICD-10-CM

## 2020-07-02 LAB — NOVEL CORONAVIRUS, NAA: SARS-CoV-2, NAA: NOT DETECTED

## 2020-08-29 IMAGING — US US RENAL
1 series · 14 of 25 positions shown · non-contrast
Comparison: None.

CLINICAL DATA: Chronic renal disease

EXAM:
RENAL / URINARY TRACT ULTRASOUND COMPLETE

[Series 1: us renal · 0.25mm/px · 14 of 33 slices shown]
[im 1/33]
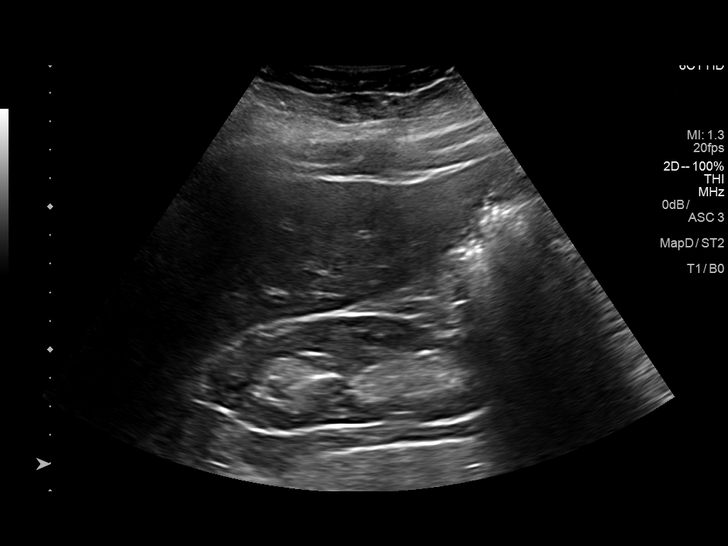
[im 3/33]
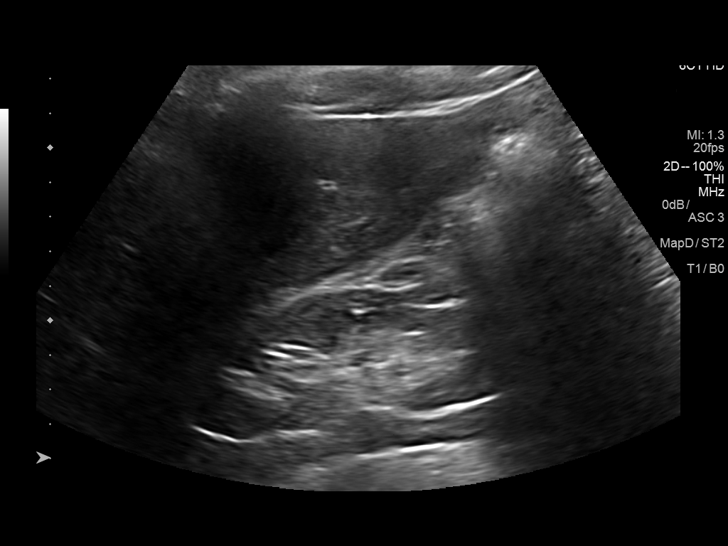
[im 6/33]
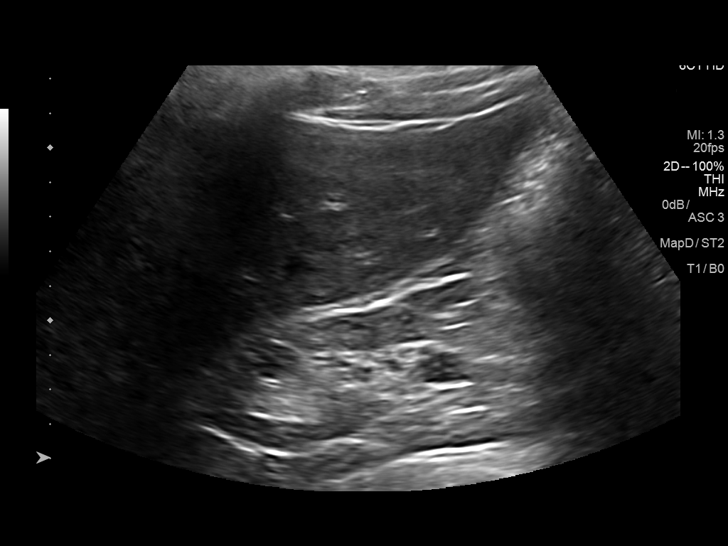
[im 9/33]
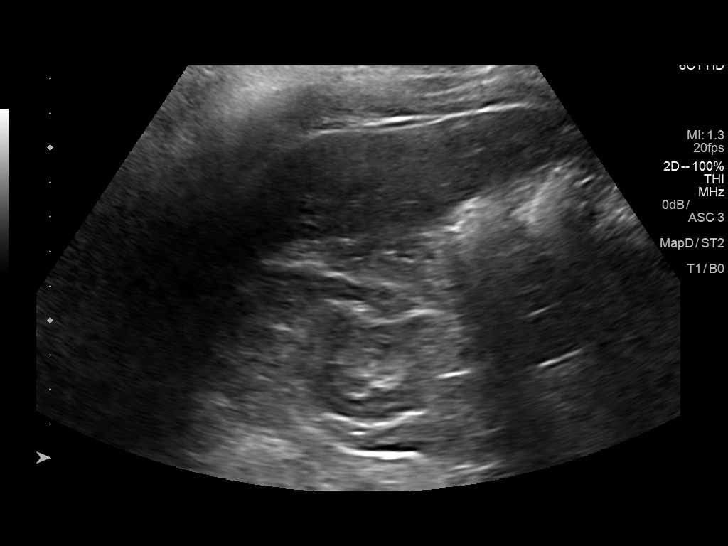
[im 11/33]
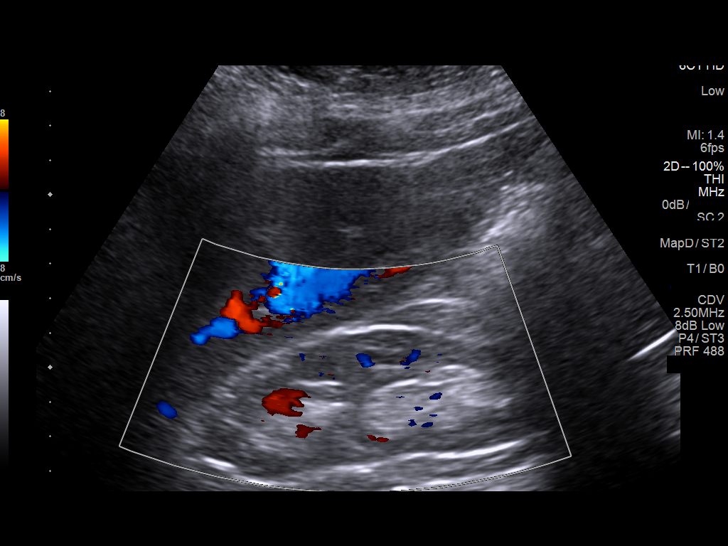
[im 13/33]
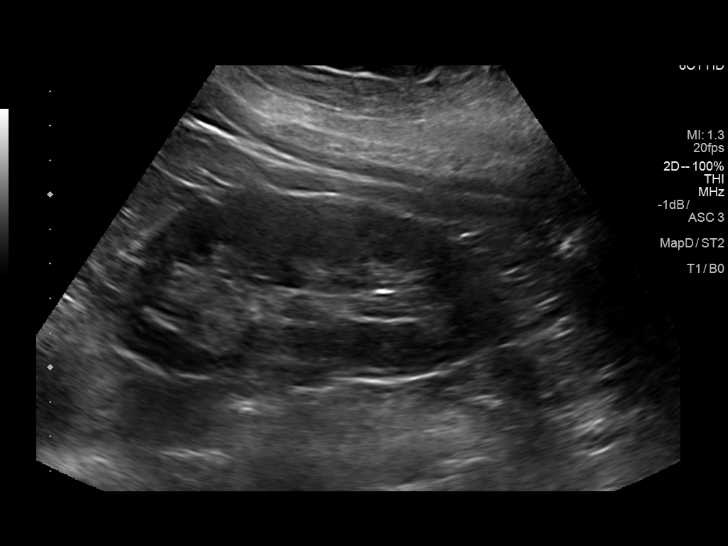
[im 15/33]
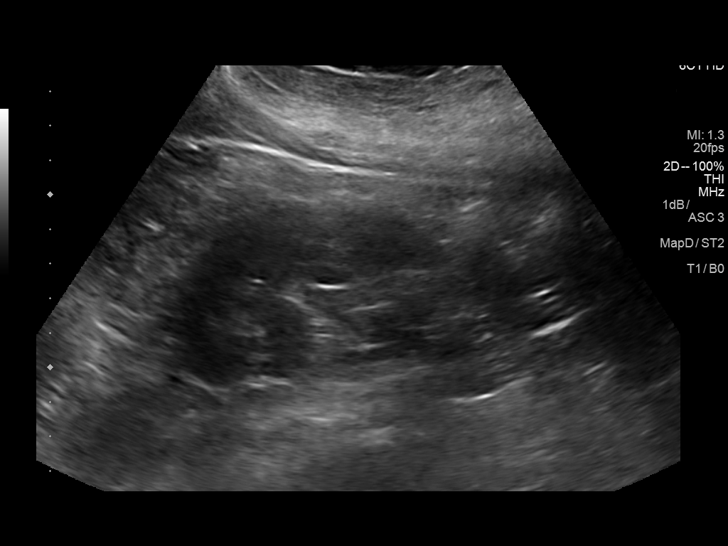
[im 18/33]
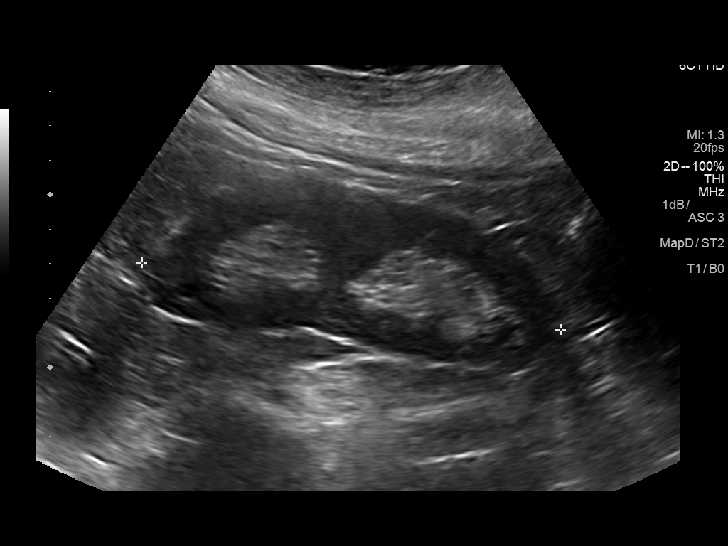
[im 21/33]
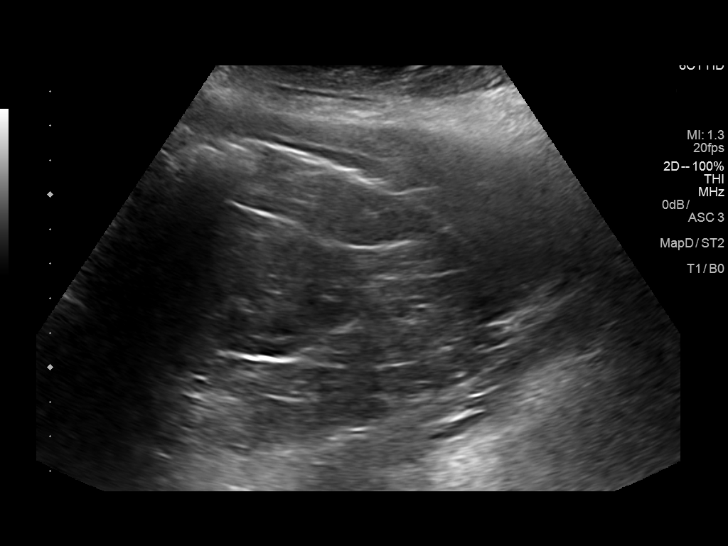
[im 22/33]
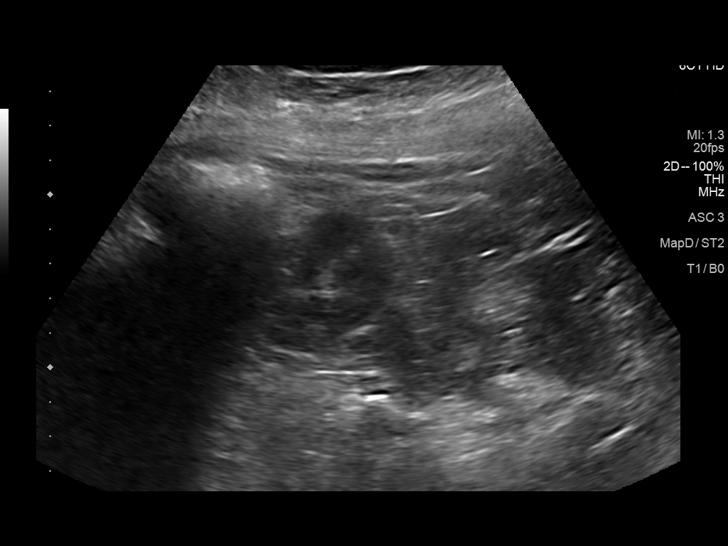
[im 25/33]
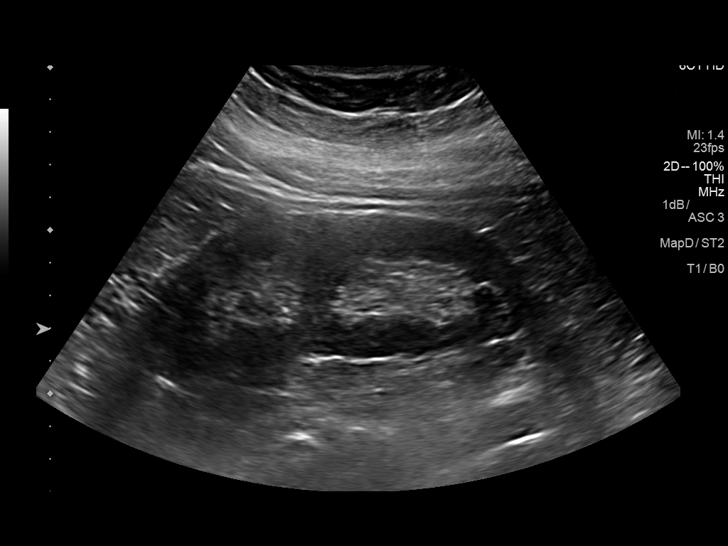
[im 27/33]
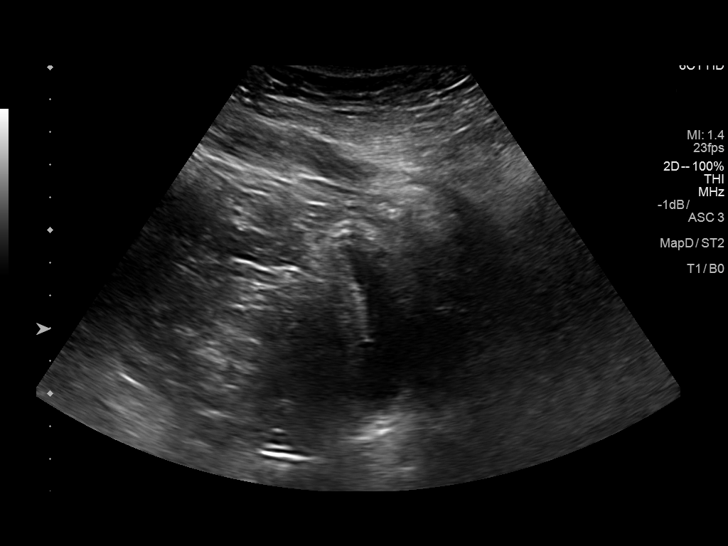
[im 30/33]
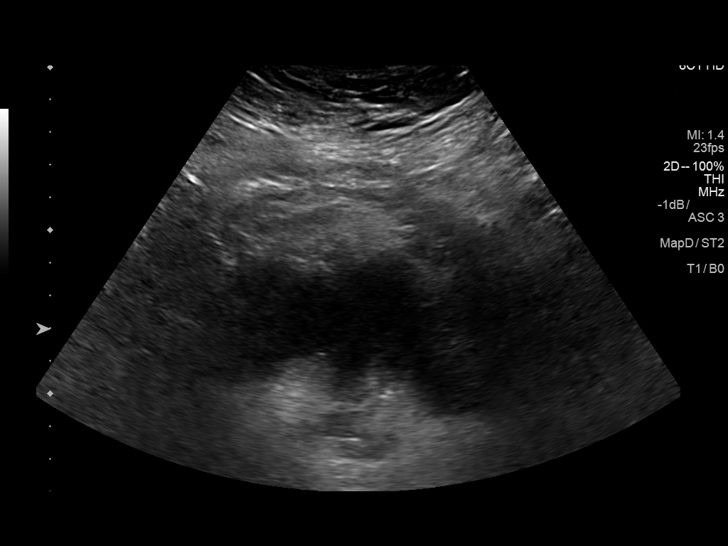
[im 33/33]
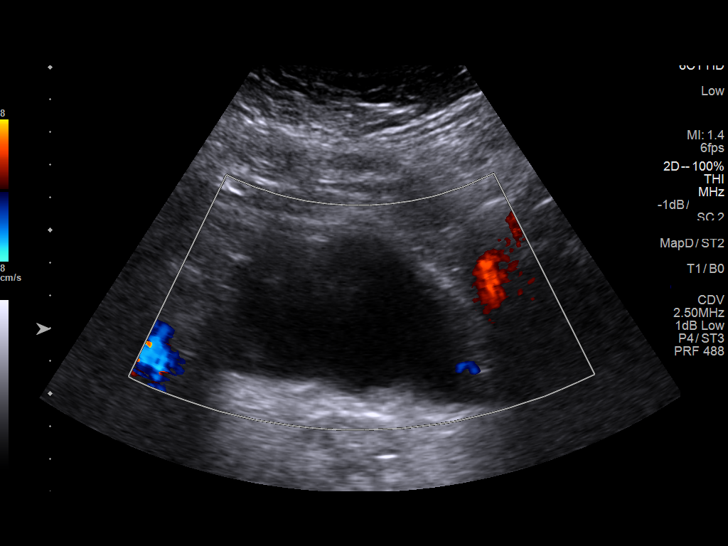

[14 of 25 positions shown; findings below may reference images not displayed]

FINDINGS: Right Kidney:

Length: 10.8 cm. Echogenicity within normal limits. No mass or
hydronephrosis visualized.

Left Kidney:

Length: 12.3 cm. Echogenicity within normal limits. No mass or
hydronephrosis visualized.

Bladder:

Appears normal for degree of bladder distention.
IMPRESSION: No acute abnormality in the kidneys.

## 2020-10-06 ENCOUNTER — Other Ambulatory Visit: Payer: Self-pay | Admitting: Cardiology

## 2020-11-08 ENCOUNTER — Other Ambulatory Visit: Payer: Self-pay | Admitting: Cardiology

## 2020-11-08 DIAGNOSIS — I701 Atherosclerosis of renal artery: Secondary | ICD-10-CM

## 2020-11-21 ENCOUNTER — Ambulatory Visit: Payer: BC Managed Care – PPO | Admitting: Obstetrics & Gynecology

## 2020-12-12 ENCOUNTER — Other Ambulatory Visit: Payer: BC Managed Care – PPO

## 2020-12-13 ENCOUNTER — Other Ambulatory Visit: Payer: Self-pay

## 2020-12-13 ENCOUNTER — Ambulatory Visit: Payer: BC Managed Care – PPO

## 2020-12-13 DIAGNOSIS — I6523 Occlusion and stenosis of bilateral carotid arteries: Secondary | ICD-10-CM

## 2020-12-16 ENCOUNTER — Other Ambulatory Visit: Payer: Self-pay | Admitting: Cardiology

## 2020-12-16 DIAGNOSIS — I6523 Occlusion and stenosis of bilateral carotid arteries: Secondary | ICD-10-CM

## 2020-12-19 ENCOUNTER — Ambulatory Visit: Payer: BC Managed Care – PPO | Admitting: Cardiology

## 2020-12-19 ENCOUNTER — Other Ambulatory Visit: Payer: Self-pay

## 2020-12-19 ENCOUNTER — Encounter: Payer: Self-pay | Admitting: Cardiology

## 2020-12-19 VITALS — BP 140/63 | HR 70 | Temp 98.1°F | Resp 16 | Ht 63.0 in | Wt 234.0 lb

## 2020-12-19 DIAGNOSIS — E1165 Type 2 diabetes mellitus with hyperglycemia: Secondary | ICD-10-CM

## 2020-12-19 DIAGNOSIS — I10 Essential (primary) hypertension: Secondary | ICD-10-CM

## 2020-12-19 DIAGNOSIS — R9431 Abnormal electrocardiogram [ECG] [EKG]: Secondary | ICD-10-CM

## 2020-12-19 DIAGNOSIS — N184 Chronic kidney disease, stage 4 (severe): Secondary | ICD-10-CM

## 2020-12-19 DIAGNOSIS — I6523 Occlusion and stenosis of bilateral carotid arteries: Secondary | ICD-10-CM

## 2020-12-19 DIAGNOSIS — E1122 Type 2 diabetes mellitus with diabetic chronic kidney disease: Secondary | ICD-10-CM

## 2020-12-19 NOTE — Progress Notes (Signed)
Primary Physician/Referring:  Carol Ada, MD  Patient ID: Julie [name], female    DOB: Mar 26, 1956, 65 y.o.   MRN: [id number]  Chief Complaint  Patient presents with  . Hypertension  . Follow-up  . carotid stenosis  . Carotid   HPI:    Julie [name]  is a 65 y.o. AAF patient with severe anxiety, long-standing history of difficult to control hypertension, orthostatic hypotension due to diabetic autonomic insufficiency, bilateral renal artery stenting for high-grade bilateral renal artery stenosis in September 2016, stage III-IV chronic kidney disease secondary to uncontrolled diabetes mellitus, high-grade asymptomatic left carotid artery stenosis and moderate stenosis of the right carotid artery, has been reluctant to having any surgical procedures for the same, had seen Dr. Trula Slade in the past, obesity, and hyperlipidemia.  Patient presents for 6 month follow up on hypertension, hyperlipidemia, diabetes mellitus, and bilateral carotid stenosis. Reports she checks her blood pressure regularly at home and averages 754-492 systolic and 01-00 diastolic. Denies chest pain, palpitations, claudication, shortness of breath, orthopnea. No symptoms suggestive of TIA or stroke. Patient states she has continued to focus on improving her diet and making positive lifestyle changes and is steadily loosing weight. Her DM is also now well controlled.   Past Medical History:  Diagnosis Date  . Abnormal thyroid scan    decreased thyroid  . Actinomyces infection    h/o  . Anovulation    chronic  . Encounter for IUD insertion 09/15/2010 and 09/17/2005   Mirena  . Encounter for IUD insertion 06/02/1990   Paraguard  . H/O amenorrhea   . Hx of elevated lipids   . Hypertension   . Hypothyroidism   . Obesity   . Oligomenorrhea    h/o  . PCOS (polycystic ovarian syndrome)   . Type 2 diabetes mellitus ([place])    Past Surgical History:  Procedure Laterality Date  . PERIPHERAL VASCULAR  CATHETERIZATION N/A 07/05/2015   Procedure: Renal Angiography;  Surgeon: Adrian Prows, MD;  Location: [facility];  Service: Cardiovascular;  Laterality: N/A;  . PERIPHERAL VASCULAR CATHETERIZATION Bilateral 07/05/2015   Procedure: Renal Intervention;  Surgeon: Adrian Prows, MD;  Location: [facility];  Service: Cardiovascular;  Laterality: Bilateral;  right and left renal stent placement  . PERIPHERAL VASCULAR CATHETERIZATION N/A 12/16/2015   Procedure: Renal Angiography;  Surgeon: Adrian Prows, MD;  Location: [facility];  Service: Cardiovascular;  Laterality: N/A;  . PERIPHERAL VASCULAR CATHETERIZATION Left 12/16/2015   Procedure: Carotid Angiography/Bilateral;  Surgeon: Adrian Prows, MD;  Location: [facility];  Service: Cardiovascular;  Laterality: Left;  . PERIPHERAL VASCULAR CATHETERIZATION  12/16/2015   Procedure: Peripheral Vascular Balloon Angioplasty;  Surgeon: Adrian Prows, MD;  Location: [facility];  Service: Cardiovascular;;  both sides  . THYROID EXPLORATION     PT STATES HAD PROCEDURE DONE ON THYROID  . WISDOM TOOTH EXTRACTION  1976   Family History  Problem Relation Age of Onset  . Hypertension Mother   . Diabetes Mother   . Diabetes Father   . Hypertension Maternal Grandmother   . Hypertension Paternal Grandmother     Social History   Tobacco Use  . Smoking status: Never Smoker  . Smokeless tobacco: Never Used  Substance Use Topics  . Alcohol use: No   ROS  Review of Systems  Constitutional: Positive for weight loss (intentional).  Cardiovascular: Positive for dyspnea on exertion (chronic, stable). Negative for chest pain, claudication, leg swelling, palpitations, paroxysmal nocturnal  dyspnea and syncope.  Gastrointestinal: Negative for melena.  Psychiatric/Behavioral: The patient is nervous/anxious.    Objective  Blood pressure 140/63, pulse 70, temperature 98.1 F (36.7 C), resp. rate 16, height _0  (1.6 m), weight 234 lb (106.1 kg), SpO2 99  %.  Vitals with BMI 12/19/2020 06/20/2020 06/20/2020  Height _1  - _2   Weight 234 lbs - 241 lbs 13 oz  BMI 57.32 - 20.25  Systolic 427 062 376  Diastolic 63 50 37  Pulse 70 71 74   Physical Exam Vitals reviewed.  Constitutional:      Appearance: Normal appearance. She is well-developed. She is obese.  Cardiovascular:     Rate and Rhythm: Normal rate and regular rhythm.     Pulses: Intact distal pulses.          Carotid pulses are on the right side with bruit and on the left side with bruit.      Dorsalis pedis pulses are 2+ on the right side and 2+ on the left side.       Posterior tibial pulses are 1+ on the right side and 1+ on the left side.     Heart sounds: Murmur heard.   Midsystolic murmur is present with a grade of 3/6 at the upper right sternal border and apex.     Comments: No JVD, No leg edema.  Pulmonary:     Effort: Pulmonary effort is normal. No accessory muscle usage or respiratory distress.     Breath sounds: Normal breath sounds.  Abdominal:     General: Bowel sounds are normal.     Palpations: Abdomen is soft.     Comments: obese    Laboratory examination:   External labs:    Labs 12/13/2020:  Hb 11.8/HCT 36.3, platelets 148.  Serum glucose 165 mg, BUN 33, creatinine 1.74, EGFR 36 mL, potassium 4.1.  LFTs mildly abnormal with AST 53, ALT 64.  Total cholesterol 130, triglycerides 89, HDL 48, LDL 65.  TSH normal.  A1c 12/14/2020: 7.0%.  04/13/2020 LDL-C 76.000 Triglycerides 80.000  TSH 0.171  Cholesterol, total 137.000 M 02/05/20 HDL 50.000 MG 10/01/19   A1C 6.900 02/05/2020 TSH 0.171 04/13/2020  12/03/2019 Glucose Random 174.000 M  BUN 34.000 MG  Creatinine, Serum 2.140 MG CrCl Est 23.48  eGFR 80  05/04/2019   Medications and allergies   Allergies  Allergen Reactions  . Sulfa Antibiotics Rash and Other (See Comments)    Wheezing    Current Outpatient Medications on File Prior to Visit  Medication Sig Dispense Refill  . amLODipine  (NORVASC) 10 MG tablet TAKE 1 TABLET BY MOUTH DAILY 90 tablet 2  . aspirin EC 81 MG tablet Take 81 mg by mouth daily.    Marland Kitchen buPROPion (WELLBUTRIN SR) 150 MG 12 hr tablet TAKE 1 TABLET BY MOUTH TWICE DAILY 60 tablet 3  . carvedilol (COREG) 25 MG tablet Take 1 tablet (25 mg total) by mouth 2 (two) times daily with a meal. 180 tablet 3  . chlorthalidone (HYGROTON) 25 MG tablet TAKE 1 TABLET BY MOUTH EVERY MORNING 90 tablet 1  . cloNIDine (CATAPRES) 0.1 MG tablet Take 0.1 mg by mouth daily.    . clopidogrel (PLAVIX) 75 MG tablet TAKE 1 TABLET BY MOUTH DAILY 90 tablet 1  . ergocalciferol (VITAMIN D2) 50000 UNITS capsule Take 50,000 Units by mouth every Monday.     . ezetimibe (ZETIA) 10 MG tablet Take 1 tablet (10 mg total) by mouth daily. 90 tablet 3  .  hydrALAZINE (APRESOLINE) 100 MG tablet Take 100 mg by mouth 2 (two) times daily.    . insulin lispro (HUMALOG) 100 UNIT/ML injection Inject into the skin. Take with a meal 5 units, 7 units, 7 units    . Levothyroxine Sodium 150 MCG CAPS Take 150 mcg by mouth daily.     . Potassium Chloride ER 20 MEQ TBCR Take 1 tablet by mouth 2 (two) times daily.    . rosuvastatin (CRESTOR) 40 MG tablet TAKE 1 TABLET BY MOUTH DAILY 90 tablet 1  . JANUVIA 50 MG tablet Take 50 mg by mouth daily.     No current facility-administered medications on file prior to visit.    Radiology:   Ultrasound of the kidneys 07/25/2018: No acute abnormality in the kidneys.  Cardiac Studies:   Echo- 05/31/2015 1. Left ventricle cavity is normal in size. Moderate concentric hypertrophy of the left ventricle. Normal global wall motion. Calculated EF 68%. 2. Mild mitral regurgitation. 3. Mild tricuspid regurgitation. No evidence of pulmonary hypertension.  Lexiscan myoview stress test 05/20/2015:  1. The resting electrocardiogram demonstrated normal sinus rhythm, incomplete RBBB and no resting arrhythmias.  Stress EKG is non-diagnostic for ischemia as it a pharmacologic stress  using Lexiscan.  2. Myocardial perfusion imaging is normal. Overall left ventricular systolic function was normal without regional wall motion abnormalities. The left ventricular ejection fraction was 64%.  Renal artery duplex 04/24/2016: No evidence of renal artery occlusive disease in either renal artery. Study suggests patency of bilateral renal stents. Compared to 11/08/2015, right renal artery restenosis no longer present. Normal intrarenal vascular perfusion is noted in both kidneys. Diffuse plaque noted in the abdominal aorta. Clinical correlation is suggested.  Carotid artery duplex 12/13/2020: Stenosis in the right internal carotid artery (>=70%). The right PSV internal/common carotid artery ratio of 5.36 is consistent with a stenosis of >70%. Stenosis in the right common carotid artery (<50%). Stenosis in the left internal carotid artery (50-69%). Stenosis in the left common carotid artery (>50%). Stenosis in the left external carotid artery (<50%). Antegrade right vertebral artery flow. Antegrade left vertebral artery flow. There is severe diffuse plaque throughout the bilateral common carotid arteries as well with high risk for embolic complications with invasive approach.  No significant change from 06/02/2020. Follow up in six months is appropriate if clinically indicated.   EKG:  EKG 12/19/2020: Normal sinus rhythm at rate of 68 bpm, left atrial enlargement, right axis deviation, left posterior fascicular block.  Incomplete right bundle branch block.  No evidence of ischemia.  -possible pulmonary disease.  Compared to 06/20/2020, rightward axis is new.   Assessment     ICD-10-CM   1. Essential hypertension  I10 EKG 12-Lead    PCV ECHOCARDIOGRAM COMPLETE  2. CKD stage 4 due to type 2 diabetes mellitus (HCC)  E11.22    N18.4   3. Asymptomatic carotid artery stenosis, bilateral  I65.23   4. Uncontrolled type 2 diabetes mellitus with hyperglycemia (HCC)  E11.65   5. Nonspecific  abnormal electrocardiogram (ECG) (EKG)  R94.31 PCV ECHOCARDIOGRAM COMPLETE     No orders of the defined types were placed in this encounter.   Medications Discontinued During This Encounter  Medication Reason  . potassium chloride SA (K-DUR) 20 MEQ tablet Error   Orders Placed This Encounter  Procedures  . EKG 12-Lead  . PCV ECHOCARDIOGRAM COMPLETE    Standing Status:   Future    Standing Expiration Date:   12/19/2021     Recommendations:  CONSANDRA LASKE  is a 65 y.o. AAF patient with severe anxiety, long-standing history of difficult to control hypertension, orthostatic hypotension due to diabetic autonomic insufficiency, bilateral renal artery stenting for high-grade bilateral renal artery stenosis in September 2016, stage III-IV chronic kidney disease secondary to uncontrolled diabetes mellitus, high-grade asymptomatic left carotid artery stenosis and moderate stenosis of the right carotid artery, has been reluctant to having any surgical procedures for the same, had seen Dr. Trula Slade in the past, obesity, and hyperlipidemia.  Reviewed external labs. Diabetes is well controlled and managed by PCP. Lipids are at goal is <70.  Blood pressure is well controlled, although 140 mmHg today, patient extremely anxious but has been recording blood pressures around 684 mmHg systolic.  Renal function has remained stable fortunately as well.  Hence I did not make any medication changes.  She does have a midsystolic murmur in the right upper sternal border which appears to be more prominent.  I will repeat echocardiogram.  She is also showing evidence of right-sided strain and right axis deviation on EKG.  Reviewed recent carotid duplex results. Lesions are stable, but in view of diffuse disease she is high risk for cardioembolic phenomenon. Will continue medical management of risk factors and regular surveillance. Encouraged patient to continue lifestyle changes to control cardiovascular risk factors,  she has been gradually loosing weight, I have congratulated her.  I am very pleased with her progress.   Will follow up in 6 months. Ordered carotid duplex to be done before next visit.    Adrian Prows, MD, [facility] 12/19/2020, 10:21 PM Office: [phone]

## 2020-12-26 ENCOUNTER — Other Ambulatory Visit: Payer: Self-pay

## 2020-12-26 ENCOUNTER — Ambulatory Visit: Payer: BC Managed Care – PPO

## 2020-12-26 DIAGNOSIS — R9431 Abnormal electrocardiogram [ECG] [EKG]: Secondary | ICD-10-CM

## 2020-12-26 DIAGNOSIS — I10 Essential (primary) hypertension: Secondary | ICD-10-CM

## 2021-01-09 ENCOUNTER — Other Ambulatory Visit: Payer: Self-pay | Admitting: Cardiology

## 2021-01-31 ENCOUNTER — Other Ambulatory Visit: Payer: Self-pay | Admitting: Cardiology

## 2021-01-31 DIAGNOSIS — I10 Essential (primary) hypertension: Secondary | ICD-10-CM

## 2021-03-20 ENCOUNTER — Other Ambulatory Visit: Payer: Self-pay | Admitting: Cardiology

## 2021-03-20 DIAGNOSIS — E7800 Pure hypercholesterolemia, unspecified: Secondary | ICD-10-CM

## 2021-03-30 ENCOUNTER — Other Ambulatory Visit: Payer: Self-pay | Admitting: Cardiology

## 2021-04-20 ENCOUNTER — Telehealth (INDEPENDENT_AMBULATORY_CARE_PROVIDER_SITE_OTHER): Payer: BC Managed Care – PPO | Admitting: Nurse Practitioner

## 2021-04-20 DIAGNOSIS — U071 COVID-19: Secondary | ICD-10-CM

## 2021-04-20 NOTE — Patient Instructions (Addendum)
Covid 19:   Stay well hydrated  Stay active  Deep breathing exercises  May take tylenol or fever or pain  May take mucinex twice daily    Follow up:  Follow up if needed

## 2021-04-20 NOTE — Progress Notes (Signed)
Virtual Visit via Telephone Note  I connected with Julie [name] on 04/20/21 at  3:30 PM EDT by telephone and verified that I am speaking with the correct person using two identifiers.  Location: Patient: home Provider: office   I discussed the limitations, risks, security and privacy concerns of performing an evaluation and management service by telephone and the availability of in person appointments. I also discussed with the patient that there may be a patient responsible charge related to this service. The patient expressed understanding and agreed to proceed.   History of Present Illness:  Patient presents today for post-COVID care clinic visit through televisit.  Patient states that she was diagnosed with COVID on 04/17/2021.  Her symptoms started on 04/15/2021.  She is having sinus drainage and cough.  She has been prescribed molnupiravir on 04/18/2021.  Denies f/c/s, n/v/d, hemoptysis, PND, chest pain or edema.    Observations/Objective:  Vitals with BMI 12/19/2020 06/20/2020 06/20/2020  Height 5\' 3"  - 5\' 3"   Weight 234 lbs - 241 lbs 13 oz  BMI 64.33 - 29.51  Systolic 884 166 063  Diastolic 63 50 37  Pulse 70 71 74      Assessment and Plan:  Covid 19:   Stay well hydrated  Stay active  Deep breathing exercises  May take tylenol or fever or pain  May take mucinex twice daily    Follow up:  Follow up if needed    I discussed the assessment and treatment plan with the patient. The patient was provided an opportunity to ask questions and all were answered. The patient agreed with the plan and demonstrated an understanding of the instructions.   The patient was advised to call back or seek an in-person evaluation if the symptoms worsen or if the condition fails to improve as anticipated.  I provided 22 minutes of non-face-to-face time during this encounter.   Fenton Foy, NP

## 2021-06-02 ENCOUNTER — Ambulatory Visit: Payer: BC Managed Care – PPO

## 2021-06-02 ENCOUNTER — Other Ambulatory Visit: Payer: Self-pay

## 2021-06-02 DIAGNOSIS — I6523 Occlusion and stenosis of bilateral carotid arteries: Secondary | ICD-10-CM

## 2021-06-06 NOTE — Progress Notes (Signed)
Will discuss on OV soon. No repeat test ordered until her OV with me soon

## 2021-06-08 ENCOUNTER — Other Ambulatory Visit: Payer: BC Managed Care – PPO

## 2021-06-19 ENCOUNTER — Ambulatory Visit: Payer: BC Managed Care – PPO | Admitting: Cardiology

## 2021-06-19 ENCOUNTER — Encounter: Payer: Self-pay | Admitting: Cardiology

## 2021-06-19 ENCOUNTER — Other Ambulatory Visit: Payer: Self-pay

## 2021-06-19 VITALS — BP 137/67 | HR 71 | Temp 98.5°F | Resp 16 | Ht 63.0 in | Wt 240.4 lb

## 2021-06-19 DIAGNOSIS — I10 Essential (primary) hypertension: Secondary | ICD-10-CM

## 2021-06-19 DIAGNOSIS — I6523 Occlusion and stenosis of bilateral carotid arteries: Secondary | ICD-10-CM

## 2021-06-19 DIAGNOSIS — E7800 Pure hypercholesterolemia, unspecified: Secondary | ICD-10-CM

## 2021-06-19 MED ORDER — EZETIMIBE 10 MG PO TABS: ORAL_TABLET | ORAL | 3 refills | Status: DC

## 2021-06-19 NOTE — Progress Notes (Signed)
Primary Physician/Referring:  Carol Ada, MD  Patient ID: Julie [name], female    DOB: 05/12/1956, 65 y.o.   MRN: [id number]  Chief Complaint  Patient presents with   Hypertension   Carotid Artery Stenosis   Hyperlipidemia   Follow-up    6 months   HPI:    Julie [name]  is a 65 y.o. AAF patient with severe anxiety, long-standing history of difficult to control hypertension, orthostatic hypotension due to diabetic autonomic insufficiency, bilateral renal artery stenting for high-grade bilateral renal artery stenosis in September 2016, stage III-IV chronic kidney disease secondary to uncontrolled diabetes mellitus, high-grade asymptomatic left carotid artery stenosis and moderate stenosis of the right carotid artery, has been reluctant to having any surgical procedures for the same, had seen Dr. Trula Slade in the past, obesity, and hyperlipidemia.   Patient presents for 26-monthfollow-up.  At last office visit no medication changes were made, however midsystolic murmur at right upper sternal border appeared more prominent, therefore ordered repeat echocardiogram as well as carotid artery surveillance.  Carotid artery disease has progressed.  Since last visit patient has gained weight and reports dietary noncompliance as well as lack of physical activity.  Reports her last A1c in February 2022 was 7.0%, and she has since been started on Januvia.  Patient has not been monitoring his blood pressure at home.  Notably she has also been without [place] the last 1 week as her prescription ran out.  Denies chest pain, palpitations, dyspnea, orthopnea, leg swelling.  Denies claudications or symptoms suggestive of TIA/CVA.  Past Medical History:  Diagnosis Date   Abnormal thyroid scan    decreased thyroid   Actinomyces infection    h/o   Anovulation    chronic   Encounter for IUD insertion 09/15/2010 and 09/17/2005   Mirena   Encounter for IUD insertion 06/02/1990   Paraguard   H/O  amenorrhea    Hx of elevated lipids    Hypertension    Hypothyroidism    Obesity    Oligomenorrhea    h/o   PCOS (polycystic ovarian syndrome)    Type 2 diabetes mellitus ([place]    Past Surgical History:  Procedure Laterality Date   PERIPHERAL VASCULAR CATHETERIZATION N/A 07/05/2015   Procedure: Renal Angiography;  Surgeon: JAdrian Prows MD;  [facility];  Service: Cardiovascular;  Laterality: N/A;   PERIPHERAL VASCULAR CATHETERIZATION Bilateral 07/05/2015   Procedure: Renal Intervention;  Surgeon: JAdrian Prows MD;  [facility];  Service: Cardiovascular;  Laterality: Bilateral;  right and left renal stent placement   PERIPHERAL VASCULAR CATHETERIZATION N/A 12/16/2015   Procedure: Renal Angiography;  Surgeon: JAdrian Prows MD;  [facility];  Service: Cardiovascular;  Laterality: N/A;   PERIPHERAL VASCULAR CATHETERIZATION Left 12/16/2015   Procedure: Carotid Angiography/Bilateral;  Surgeon: JAdrian Prows MD;  [facility];  Service: Cardiovascular;  Laterality: Left;   PERIPHERAL VASCULAR CATHETERIZATION  12/16/2015   Procedure: Peripheral Vascular Balloon Angioplasty;  Surgeon: JAdrian Prows MD;  [facility];  Service: Cardiovascular;;  both sides   THYROID EXPLORATION     PT STATES HAD PROCEDURE DONE ON THYROID   WISDOM TOOTH EXTRACTION  1976   Family History  Problem Relation Age of Onset   Hypertension Mother    Diabetes Mother    Diabetes Father    Hypertension Maternal Grandmother    Hypertension Paternal Grandmother     Social History   Tobacco  Use   Smoking status: Never   Smokeless tobacco: Never  Substance Use Topics   Alcohol use: No   ROS  Review of Systems  Constitutional: Positive for weight gain. Negative for malaise/fatigue and weight loss.  Cardiovascular:  Positive for dyspnea on exertion (chronic, stable). Negative for chest pain, claudication, leg swelling, near-syncope, orthopnea,  palpitations, paroxysmal nocturnal dyspnea and syncope.  Gastrointestinal:  Negative for melena.  Neurological:  Negative for dizziness.  Psychiatric/Behavioral:  The patient is nervous/anxious.   Objective  Blood pressure 137/67, pulse 71, temperature 98.5 F (36.9 C), temperature source Temporal, resp. rate 16, height '5\' 3"'  (1.6 m), weight 240 lb 6.4 oz (109 kg), SpO2 98 %.  Vitals with BMI 06/19/2021 12/19/2020 06/20/2020  Height '5\' 3"'  '5\' 3"'  -  Weight 240 lbs 6 oz 234 lbs -  BMI 94.5 85.92 -  Systolic 924 462 863  Diastolic 67 63 50  Pulse 71 70 71   Physical Exam Vitals reviewed.  Constitutional:      Appearance: Normal appearance. She is well-developed. She is obese.  HENT:     Head: Normocephalic and atraumatic.  Cardiovascular:     Rate and Rhythm: Normal rate and regular rhythm.     Pulses: Intact distal pulses.          Carotid pulses are  on the right side with bruit and  on the left side with bruit.      Dorsalis pedis pulses are 2+ on the right side and 2+ on the left side.       Posterior tibial pulses are 1+ on the right side and 1+ on the left side.     Heart sounds: S1 normal and S2 normal. Murmur heard.  Midsystolic murmur is present with a grade of 3/6 at the upper right sternal border and apex.    No gallop.     Comments: No JVD, No leg edema.  Pulmonary:     Effort: Pulmonary effort is normal. No accessory muscle usage or respiratory distress.     Breath sounds: Normal breath sounds. No wheezing, rhonchi or rales.  Abdominal:     Comments: obese  Musculoskeletal:     Right lower leg: No edema.     Left lower leg: No edema.  Neurological:     Mental Status: She is alert.   Laboratory examination:   External labs:  06/13/2021: Sodium 141, potassium 3.6, BUN 30, creatinine 1.8, GFR 34 Total cholesterol 131, triglycerides 80, HDL 56, LDL 59 TSH 1.15  12/13/2020: Hb 11.8/HCT 36.3, platelets 148. Serum glucose 165 mg, BUN 33, creatinine 1.74, EGFR 36 mL,  potassium 4.1.  LFTs mildly abnormal with AST 53, ALT 64. Total cholesterol 130, triglycerides 89, HDL 48, LDL 65.  TSH normal. A1c 12/14/2020: 7.0%.  04/13/2020 LDL-C 76.000 Triglycerides 80.000  TSH 0.171  Cholesterol, total 137.000 M 02/05/20 HDL 50.000 MG 10/01/19   A1C 6.900 02/05/2020 TSH 0.171 04/13/2020  12/03/2019 Glucose Random 174.000 M  BUN 34.000 MG  Creatinine, Serum 2.140 MG CrCl Est 23.48  eGFR 80  05/04/2019  Allergies   Allergies  Allergen Reactions   Sulfa Antibiotics Rash and Other (See Comments)    Wheezing    Medications Prior to Visit:   Outpatient Medications Prior to Visit  Medication Sig Dispense Refill   amLODipine (NORVASC) 10 MG tablet TAKE 1 TABLET BY MOUTH DAILY 90 tablet 2   buPROPion (WELLBUTRIN SR) 150 MG 12 hr tablet TAKE 1 TABLET BY MOUTH TWICE DAILY  60 tablet 3   carvedilol (COREG) 25 MG tablet TAKE 1 TABLET(25 MG) BY MOUTH TWICE DAILY WITH A MEAL 180 tablet 3   chlorthalidone (HYGROTON) 25 MG tablet TAKE 1 TABLET BY MOUTH EVERY MORNING 90 tablet 1   cloNIDine (CATAPRES) 0.1 MG tablet Take 0.1 mg by mouth daily.     clopidogrel (PLAVIX) 75 MG tablet TAKE 1 TABLET BY MOUTH DAILY 90 tablet 1   ergocalciferol (VITAMIN D2) 50000 UNITS capsule Take 50,000 Units by mouth. 1 every 2 weeks     hydrALAZINE (APRESOLINE) 100 MG tablet Take 100 mg by mouth 2 (two) times daily.     insulin lispro (HUMALOG) 100 UNIT/ML injection Inject into the skin. Take with a meal 5 units, 7 units, 7 units     JANUVIA 50 MG tablet Take 50 mg by mouth daily.     Levothyroxine Sodium 150 MCG CAPS Take 150 mcg by mouth daily.      Potassium Chloride ER 20 MEQ TBCR Take 1 tablet by mouth 2 (two) times daily.     rosuvastatin (CRESTOR) 40 MG tablet TAKE 1 TABLET BY MOUTH DAILY 90 tablet 1   aspirin EC 81 MG tablet Take 81 mg by mouth daily.     ezetimibe (ZETIA) 10 MG tablet TAKE 1 TABLET(10 MG) BY MOUTH DAILY 90 tablet 3   No facility-administered medications prior to  visit.   Final Medications at End of Visit    Current Meds  Medication Sig   amLODipine (NORVASC) 10 MG tablet TAKE 1 TABLET BY MOUTH DAILY   buPROPion (WELLBUTRIN SR) 150 MG 12 hr tablet TAKE 1 TABLET BY MOUTH TWICE DAILY   carvedilol (COREG) 25 MG tablet TAKE 1 TABLET(25 MG) BY MOUTH TWICE DAILY WITH A MEAL   chlorthalidone (HYGROTON) 25 MG tablet TAKE 1 TABLET BY MOUTH EVERY MORNING   cloNIDine (CATAPRES) 0.1 MG tablet Take 0.1 mg by mouth daily.   clopidogrel (PLAVIX) 75 MG tablet TAKE 1 TABLET BY MOUTH DAILY   ergocalciferol (VITAMIN D2) 50000 UNITS capsule Take 50,000 Units by mouth. 1 every 2 weeks   hydrALAZINE (APRESOLINE) 100 MG tablet Take 100 mg by mouth 2 (two) times daily.   insulin lispro (HUMALOG) 100 UNIT/ML injection Inject into the skin. Take with a meal 5 units, 7 units, 7 units   JANUVIA 50 MG tablet Take 50 mg by mouth daily.   Levothyroxine Sodium 150 MCG CAPS Take 150 mcg by mouth daily.    Potassium Chloride ER 20 MEQ TBCR Take 1 tablet by mouth 2 (two) times daily.   rosuvastatin (CRESTOR) 40 MG tablet TAKE 1 TABLET BY MOUTH DAILY    Radiology:   Ultrasound of the kidneys 07/25/2018: No acute abnormality in the kidneys.  Cardiac Studies:   Lexiscan myoview stress test 05/20/2015:  1. The resting electrocardiogram demonstrated normal sinus rhythm, incomplete RBBB and no resting arrhythmias.  Stress EKG is non-diagnostic for ischemia as it a pharmacologic stress using Lexiscan.  2. Myocardial perfusion imaging is normal. Overall left ventricular systolic function was normal without regional wall motion abnormalities. The left ventricular ejection fraction was 64%.  Renal artery duplex 04/24/2016: No evidence of renal artery occlusive disease in either renal artery. Study suggests patency of bilateral renal stents. Compared to 11/08/2015, right renal artery restenosis no longer present. Normal intrarenal vascular perfusion is noted in both kidneys. Diffuse  plaque noted in the abdominal aorta. Clinical correlation is suggested.  Echocardiogram 12/26/2020:    Normal LV systolic function  with EF 57%. Left ventricle cavity is normal in size. Moderate concentric hypertrophy of the left ventricle. Normal global wall motion. Normal diastolic filling pattern. Calculated EF 57%.  Structurally normal tricuspid valve.  Mild tricuspid regurgitation. No evidence of pulmonary hypertension.  No significant change from 05/31/2015.  Carotid artery duplex 06/01/2021:  Duplex suggests stenosis in the right internal carotid artery (>=70%). The PSV internal/common carotid artery ratio of 5.57 is consistent with a stenosis of >70% bilaterally. Duplex suggests stenosis in the right common carotid artery (<50%). Duplex suggests stenosis in the right external  carotid artery (<50%).  Duplex suggests stenosis in the left internal carotid artery (80-99%).  Proximal ICA velocity noted 422/144 cm/Sec. Left carotid bulb stenosis of >50%.  Antegrade right and left vertebral artery flow.  Compared to 12/13/2020, left ICA stenosis has progressed from <70%. Follow up in six months is appropriate if clinically indicated. Consider different modality evaluation (CTA neck) to evaluate stenosis significance.   EKG   06/19/2021: Normal sinus rhythm at a rate of 66 bpm.  Left atrial enlargement. Right axis deviation, left posterior fascicular block.  Incomplete right bundle branch block.  No evidence of ischemia or underlying injury pattern.  Consider pulmonary disease.  Compared to EKG 12/19/2020, no significant change.  12/19/2020: Normal sinus rhythm at rate of 68 bpm, left atrial enlargement, right axis deviation, left posterior fascicular block.  Incomplete right bundle branch block.  No evidence of ischemia.  -possible pulmonary disease.  Compared to 06/20/2020, rightward axis is new.   Assessment     ICD-10-CM   1. Essential hypertension  I10 EKG 12-Lead    2.  Hypercholesteremia  E78.00 ezetimibe (ZETIA) 10 MG tablet    3. Asymptomatic carotid artery stenosis, bilateral  I65.23 CT ANGIO NECK W OR WO CONTRAST       Meds ordered this encounter  Medications   ezetimibe (ZETIA) 10 MG tablet    Sig: TAKE 1 TABLET(10 MG) BY MOUTH DAILY    Dispense:  90 tablet    Refill:  3    ZERO refills remain on this prescription. Your patient is requesting advance approval of refills for this medication to [facility]    Medications Discontinued During This Encounter  Medication Reason   aspirin EC 81 MG tablet Error   ezetimibe (ZETIA) 10 MG tablet Reorder   Orders Placed This Encounter  Procedures   CT ANGIO NECK W OR WO CONTRAST    Standing Status:   Future    Standing Expiration Date:   06/19/2022    Order Specific Question:   If indicated for the ordered procedure, I authorize the administration of contrast media per Radiology protocol    Answer:   Yes    Order Specific Question:   Preferred imaging location?    Answer:   [id number]   EKG 12-Lead     Recommendations:   CATRINIA RACICOT  is a 65 y.o. AAF patient with severe anxiety, long-standing history of difficult to control hypertension, orthostatic hypotension due to diabetic autonomic insufficiency, bilateral renal artery stenting for high-grade bilateral renal artery stenosis in September 2016, stage III-IV chronic kidney disease secondary to uncontrolled diabetes mellitus, high-grade asymptomatic left carotid artery stenosis and moderate stenosis of the right carotid artery, has been reluctant to having any surgical procedures for the same, had seen Dr. Trula Slade in the past, obesity, and hyperlipidemia.  Patient presents for 67-monthfollow-up.  I personally reviewed external labs.  Lipids under excellent  control with LDL 59.  Patient's diabetes is also well controlled, managed by PCP.  Patient's blood pressure is elevated in the office today, however she is extremely anxious in  our office typically which is likely contributing to elevated blood pressure.  Unfortunately patient has not been monitoring her blood pressure readings at home.  Patient will monitor her blood pressure on a daily basis and bring a written log to her next appointment.  Will not make changes to antihypertensive medications at this time.  Reviewed and discussed with patient findings of echocardiogram, which are stable since 2016.  Also reviewed and discussed progression of carotid artery disease.  Shared decision was to proceed with CTA of the neck to further evaluate bilateral carotid artery stenosis.  Counseled patient regarding the importance of diet and lifestyle modifications, particularly weight loss and increasing physical activity.  Follow-up in 3 months, sooner if needed, for hypertension, carotid artery disease.    Alethia Berthold, PA-C 06/19/2021, 3:06 PM Office: [phone]

## 2021-06-24 ENCOUNTER — Other Ambulatory Visit: Payer: Self-pay | Admitting: Cardiology

## 2021-07-11 ENCOUNTER — Other Ambulatory Visit: Payer: Self-pay | Admitting: Cardiology

## 2021-07-19 ENCOUNTER — Other Ambulatory Visit: Payer: Self-pay

## 2021-07-19 ENCOUNTER — Ambulatory Visit
Admission: RE | Admit: 2021-07-19 | Discharge: 2021-07-19 | Disposition: A | Discharge status: Home / Self Care | Payer: BC Managed Care – PPO | Source: Ambulatory Visit | Attending: Student | Admitting: Student

## 2021-07-19 DIAGNOSIS — E1122 Type 2 diabetes mellitus with diabetic chronic kidney disease: Secondary | ICD-10-CM

## 2021-07-19 DIAGNOSIS — N183 Chronic kidney disease, stage 3 unspecified: Secondary | ICD-10-CM

## 2021-07-19 DIAGNOSIS — I6523 Occlusion and stenosis of bilateral carotid arteries: Secondary | ICD-10-CM

## 2021-07-19 MED ORDER — IOPAMIDOL (ISOVUE-370) INJECTION 76%: 75.0000 mL | Freq: Once | INTRAVENOUS | Status: DC | PRN

## 2021-07-19 NOTE — Progress Notes (Signed)
Istat Creat 2.0 today GFR 27 today at [facility] upon arrival for CTA Neck study- Exam could not be completed due to impaired renal function- Siana at [facility] office notified of test results and unable to complete exam at this time -office will contact patient about alternative imaging

## 2021-07-23 ENCOUNTER — Other Ambulatory Visit: Payer: Self-pay | Admitting: Cardiology

## 2021-07-23 DIAGNOSIS — I701 Atherosclerosis of renal artery: Secondary | ICD-10-CM

## 2021-09-18 NOTE — Progress Notes (Signed)
Primary Physician/Referring:  Carol Ada, MD  Patient ID: Julie [name], female    DOB: 1956/04/16, 65 y.o.   MRN: [id number]  Chief Complaint  Patient presents with   Hypertension   Carotid stenosis   Follow-up    3 month   HPI:    Julie [name]  is a 65 y.o. AAF patient with severe anxiety, long-standing history of difficult to control hypertension, orthostatic hypotension due to diabetic autonomic insufficiency, bilateral renal artery stenting for high-grade bilateral renal artery stenosis in September 2016, stage III-IV chronic kidney disease secondary to uncontrolled diabetes mellitus, high-grade asymptomatic left carotid artery stenosis and moderate stenosis of the right carotid artery, has been reluctant to having any surgical procedures for the same, had seen Dr. Trula Slade in the past, obesity, and hyperlipidemia.   Patient presents for 64-monthfollow-up of hypertension and carotid artery disease.  Last office visit carotid artery disease progressed, therefore ordered CTA of the neck, however this was unable to be completed due to worsening renal function.  Overall patient is feeling well and reports home blood pressure readings which are well controlled.  She is tolerating present medications without issues. Denies chest pain, palpitations, dyspnea, orthopnea, leg swelling.  Denies claudications or symptoms suggestive of TIA/CVA.  Past Medical History:  Diagnosis Date   Abnormal thyroid scan    decreased thyroid   Actinomyces infection    h/o   Anovulation    chronic   Encounter for IUD insertion 09/15/2010 and 09/17/2005   Mirena   Encounter for IUD insertion 06/02/1990   Paraguard   H/O amenorrhea    Hx of elevated lipids    Hypertension    Hypothyroidism    Obesity    Oligomenorrhea    h/o   PCOS (polycystic ovarian syndrome)    Type 2 diabetes mellitus ([facility]    Past Surgical History:  Procedure Laterality Date   PERIPHERAL VASCULAR CATHETERIZATION N/A  07/05/2015   Procedure: Renal Angiography;  Surgeon: JAdrian Prows MD;  [facility];  Service: Cardiovascular;  Laterality: N/A;   PERIPHERAL VASCULAR CATHETERIZATION Bilateral 07/05/2015   Procedure: Renal Intervention;  Surgeon: JAdrian Prows MD;  [facility];  Service: Cardiovascular;  Laterality: Bilateral;  right and left renal stent placement   PERIPHERAL VASCULAR CATHETERIZATION N/A 12/16/2015   Procedure: Renal Angiography;  Surgeon: JAdrian Prows MD;  [facility];  Service: Cardiovascular;  Laterality: N/A;   PERIPHERAL VASCULAR CATHETERIZATION Left 12/16/2015   Procedure: Carotid Angiography/Bilateral;  Surgeon: JAdrian Prows MD;  [facility];  Service: Cardiovascular;  Laterality: Left;   PERIPHERAL VASCULAR CATHETERIZATION  12/16/2015   Procedure: Peripheral Vascular Balloon Angioplasty;  Surgeon: JAdrian Prows MD;  [facility];  Service: Cardiovascular;;  both sides   THYROID EXPLORATION     PT STATES HAD PROCEDURE DONE ON THYROID   WISDOM TOOTH EXTRACTION  1976   Family History  Problem Relation Age of Onset   Hypertension Mother    Diabetes Mother    Diabetes Father    Hypertension Maternal Grandmother    Hypertension Paternal Grandmother     Social History   Tobacco Use   Smoking status: Never   Smokeless tobacco: Never  Substance Use Topics   Alcohol use: No   ROS  Review of Systems  Cardiovascular:  Positive for dyspnea on exertion (chronic, stable). Negative for chest pain, claudication, leg swelling, near-syncope, orthopnea, palpitations, paroxysmal nocturnal dyspnea and syncope.  Gastrointestinal:  Negative for melena.  Neurological:  Negative for dizziness.  Psychiatric/Behavioral:  The patient is nervous/anxious.   Objective  Blood pressure (!) 126/55, pulse 69, temperature 97.8 F (36.6 C), resp. rate 16, height '5\' 3"'  (1.6 m), weight 242 lb (109.8 kg), SpO2 100 %.  Vitals with BMI 09/19/2021  06/19/2021 12/19/2020  Height '5\' 3"'  '5\' 3"'  '5\' 3"'   Weight 242 lbs 240 lbs 6 oz 234 lbs  BMI 42.88 56.3 87.56  Systolic 433 295 188  Diastolic 55 67 63  Pulse 69 71 70   Physical Exam Vitals reviewed.  Constitutional:      Appearance: She is well-developed. She is obese.  Cardiovascular:     Rate and Rhythm: Normal rate and regular rhythm.     Pulses: Intact distal pulses.          Carotid pulses are  on the right side with bruit and  on the left side with bruit.      Dorsalis pedis pulses are 2+ on the right side and 2+ on the left side.       Posterior tibial pulses are 1+ on the right side and 1+ on the left side.     Heart sounds: S1 normal and S2 normal. Murmur heard.  Midsystolic murmur is present with a grade of 3/6 at the upper right sternal border and apex.    No gallop.     Comments: No JVD Pulmonary:     Effort: Pulmonary effort is normal. No accessory muscle usage or respiratory distress.     Breath sounds: Normal breath sounds. No wheezing, rhonchi or rales.  Abdominal:     Comments: obese  Musculoskeletal:     Right lower leg: No edema.     Left lower leg: No edema.   Laboratory examination:   External labs:  06/13/2021: Sodium 141, potassium 3.6, BUN 30, creatinine 1.8, GFR 34 Total cholesterol 131, triglycerides 80, HDL 56, LDL 59 TSH 1.15  12/13/2020: Hb 11.8/HCT 36.3, platelets 148. Serum glucose 165 mg, BUN 33, creatinine 1.74, EGFR 36 mL, potassium 4.1.  LFTs mildly abnormal with AST 53, ALT 64. Total cholesterol 130, triglycerides 89, HDL 48, LDL 65.  TSH normal. A1c 12/14/2020: 7.0%.  04/13/2020 LDL-C 76.000 Triglycerides 80.000  TSH 0.171  Cholesterol, total 137.000 M 02/05/20 HDL 50.000 MG 10/01/19   A1C 6.900 02/05/2020 TSH 0.171 04/13/2020  12/03/2019 Glucose Random 174.000 M  BUN 34.000 MG  Creatinine, Serum 2.140 MG CrCl Est 23.48  eGFR 80  05/04/2019  Allergies   Allergies  Allergen Reactions   Sulfa Antibiotics Rash and Other (See  Comments)    Wheezing    Medications Prior to Visit:   Outpatient Medications Prior to Visit  Medication Sig Dispense Refill   amLODipine (NORVASC) 10 MG tablet TAKE 1 TABLET BY MOUTH DAILY 90 tablet 2   buPROPion (WELLBUTRIN SR) 150 MG 12 hr tablet TAKE 1 TABLET BY MOUTH TWICE DAILY 60 tablet 3   carvedilol (COREG) 25 MG tablet TAKE 1 TABLET(25 MG) BY MOUTH TWICE DAILY WITH A MEAL 180 tablet 3   chlorthalidone (HYGROTON) 25 MG tablet TAKE 1 TABLET BY MOUTH EVERY MORNING 90 tablet 1   cloNIDine (CATAPRES) 0.1 MG tablet Take 0.1 mg by mouth daily.     clopidogrel (PLAVIX) 75 MG tablet TAKE 1 TABLET BY MOUTH DAILY 90 tablet 1   ergocalciferol (VITAMIN D2) 50000 UNITS capsule Take 50,000 Units by mouth. 1 every 2 weeks     ezetimibe (  ZETIA) 10 MG tablet TAKE 1 TABLET(10 MG) BY MOUTH DAILY 90 tablet 3   hydrALAZINE (APRESOLINE) 100 MG tablet Take 100 mg by mouth 2 (two) times daily.     Levothyroxine Sodium 150 MCG CAPS Take 150 mcg by mouth daily.      Potassium Chloride ER 20 MEQ TBCR Take 1 tablet by mouth 2 (two) times daily.     rosuvastatin (CRESTOR) 40 MG tablet TAKE 1 TABLET BY MOUTH DAILY 90 tablet 1   tirzepatide (MOUNJARO) 2.5 MG/0.5ML Pen 2.5 mg once a week.     insulin lispro (HUMALOG) 100 UNIT/ML injection Inject into the skin. Take with a meal 5 units, 7 units, 7 units     JANUVIA 50 MG tablet Take 50 mg by mouth daily.     No facility-administered medications prior to visit.   Final Medications at End of Visit    Current Meds  Medication Sig   amLODipine (NORVASC) 10 MG tablet TAKE 1 TABLET BY MOUTH DAILY   buPROPion (WELLBUTRIN SR) 150 MG 12 hr tablet TAKE 1 TABLET BY MOUTH TWICE DAILY   carvedilol (COREG) 25 MG tablet TAKE 1 TABLET(25 MG) BY MOUTH TWICE DAILY WITH A MEAL   chlorthalidone (HYGROTON) 25 MG tablet TAKE 1 TABLET BY MOUTH EVERY MORNING   cloNIDine (CATAPRES) 0.1 MG tablet Take 0.1 mg by mouth daily.   clopidogrel (PLAVIX) 75 MG tablet TAKE 1 TABLET BY MOUTH  DAILY   ergocalciferol (VITAMIN D2) 50000 UNITS capsule Take 50,000 Units by mouth. 1 every 2 weeks   ezetimibe (ZETIA) 10 MG tablet TAKE 1 TABLET(10 MG) BY MOUTH DAILY   hydrALAZINE (APRESOLINE) 100 MG tablet Take 100 mg by mouth 2 (two) times daily.   Levothyroxine Sodium 150 MCG CAPS Take 150 mcg by mouth daily.    Potassium Chloride ER 20 MEQ TBCR Take 1 tablet by mouth 2 (two) times daily.   rosuvastatin (CRESTOR) 40 MG tablet TAKE 1 TABLET BY MOUTH DAILY   tirzepatide (MOUNJARO) 2.5 MG/0.5ML Pen 2.5 mg once a week.    Radiology:   Ultrasound of the kidneys 07/25/2018: No acute abnormality in the kidneys.  Cardiac Studies:   Lexiscan myoview stress test 05/20/2015:  1. The resting electrocardiogram demonstrated normal sinus rhythm, incomplete RBBB and no resting arrhythmias.  Stress EKG is non-diagnostic for ischemia as it a pharmacologic stress using Lexiscan.  2. Myocardial perfusion imaging is normal. Overall left ventricular systolic function was normal without regional wall motion abnormalities. The left ventricular ejection fraction was 64%.  Renal artery duplex 04/24/2016: No evidence of renal artery occlusive disease in either renal artery. Study suggests patency of bilateral renal stents. Compared to 11/08/2015, right renal artery restenosis no longer present. Normal intrarenal vascular perfusion is noted in both kidneys. Diffuse plaque noted in the abdominal aorta. Clinical correlation is suggested.  Echocardiogram 12/26/2020:    Normal LV systolic function with EF 57%. Left ventricle cavity is normal in size. Moderate concentric hypertrophy of the left ventricle. Normal global wall motion. Normal diastolic filling pattern. Calculated EF 57%.  Structurally normal tricuspid valve.  Mild tricuspid regurgitation. No evidence of pulmonary hypertension.  No significant change from 05/31/2015.  Carotid artery duplex 06/01/2021:  Duplex suggests stenosis in the right internal  carotid artery (>=70%). The PSV internal/common carotid artery ratio of 5.57 is consistent with a stenosis of >70% bilaterally. Duplex suggests stenosis in the right common carotid artery (<50%). Duplex suggests stenosis in the right external  carotid artery (<50%).  Duplex  suggests stenosis in the left internal carotid artery (80-99%).  Proximal ICA velocity noted 422/144 cm/Sec. Left carotid bulb stenosis of >50%.  Antegrade right and left vertebral artery flow.  Compared to 12/13/2020, left ICA stenosis has progressed from <70%. Follow up in six months is appropriate if clinically indicated. Consider different modality evaluation (CTA neck) to evaluate stenosis significance.  EKG   06/19/2021: Normal sinus rhythm at a rate of 66 bpm.  Left atrial enlargement. Right axis deviation, left posterior fascicular block.  Incomplete right bundle branch block.  No evidence of ischemia or underlying injury pattern.  Consider pulmonary disease.  Compared to EKG 12/19/2020, no significant change.  12/19/2020: Normal sinus rhythm at rate of 68 bpm, left atrial enlargement, right axis deviation, left posterior fascicular block.  Incomplete right bundle branch block.  No evidence of ischemia.  -possible pulmonary disease.  Compared to 06/20/2020, rightward axis is new.   Assessment     ICD-10-CM   1. Asymptomatic carotid artery stenosis, bilateral  I65.23 PCV CAROTID DUPLEX (BILATERAL)    2. Essential hypertension  I10     3. Hypercholesteremia  E78.00        No orders of the defined types were placed in this encounter.   Medications Discontinued During This Encounter  Medication Reason   insulin lispro (HUMALOG) 100 UNIT/ML injection    JANUVIA 50 MG tablet    No orders of the defined types were placed in this encounter.    Recommendations:   Julie [name]  is a 65 y.o. AAF patient with severe anxiety, long-standing history of difficult to control hypertension, orthostatic hypotension due to  diabetic autonomic insufficiency, bilateral renal artery stenting for high-grade bilateral renal artery stenosis in September 2016, stage III-IV chronic kidney disease secondary to uncontrolled diabetes mellitus, high-grade asymptomatic left carotid artery stenosis and moderate stenosis of the right carotid artery, has been reluctant to having any surgical procedures for the same, had seen Dr. Trula Slade in the past, obesity, and hyperlipidemia.  Patient presents for 39-monthfollow-up of hypertension and carotid artery disease.  Last office visit carotid artery disease progressed, therefore ordered CTA of the neck, however this was unable to be done due to worsening renal function.  I have requested labs from patient's nephrologist.  I personally reviewed external labs, lipids are under excellent control.  Patient is otherwise feeling well without specific complaints.  Physical exam remained stable.  Blood pressure is well controlled.  Will hold off on CTA of the neck to further evaluate carotid stenosis given poor renal function, will instead obtain repeat carotid artery duplex 6 months from the last, which will be in February 2023.  Again counseled patient regarding the importance of diet and lifestyle modification.  Patient is otherwise stable from a cardiovascular standpoint.  Follow-up in 3 months, sooner if needed.   CAlethia Berthold PA-C 09/19/2021, 3:42 PM Office: [phone]

## 2021-09-19 ENCOUNTER — Encounter: Payer: Self-pay | Admitting: Student

## 2021-09-19 ENCOUNTER — Ambulatory Visit: Payer: BC Managed Care – PPO | Admitting: Student

## 2021-09-19 ENCOUNTER — Other Ambulatory Visit: Payer: Self-pay

## 2021-09-19 VITALS — BP 126/55 | HR 69 | Temp 97.8°F | Resp 16 | Ht 63.0 in | Wt 242.0 lb

## 2021-09-19 DIAGNOSIS — E7800 Pure hypercholesterolemia, unspecified: Secondary | ICD-10-CM

## 2021-09-19 DIAGNOSIS — I10 Essential (primary) hypertension: Secondary | ICD-10-CM

## 2021-09-19 DIAGNOSIS — I6523 Occlusion and stenosis of bilateral carotid arteries: Secondary | ICD-10-CM

## 2021-10-03 ENCOUNTER — Other Ambulatory Visit: Payer: Self-pay | Admitting: Cardiology

## 2021-10-27 ENCOUNTER — Other Ambulatory Visit: Payer: Self-pay | Admitting: Cardiology

## 2021-12-13 ENCOUNTER — Other Ambulatory Visit: Payer: Self-pay

## 2021-12-13 ENCOUNTER — Ambulatory Visit: Payer: Medicare PPO

## 2021-12-13 DIAGNOSIS — I6523 Occlusion and stenosis of bilateral carotid arteries: Secondary | ICD-10-CM

## 2021-12-15 NOTE — Progress Notes (Signed)
Called patient, NA, LMAM

## 2021-12-19 NOTE — Progress Notes (Signed)
Called pt, no answer. Left vm requesting call back?

## 2021-12-20 ENCOUNTER — Other Ambulatory Visit: Payer: Medicare PPO

## 2021-12-21 NOTE — Progress Notes (Signed)
3rd attempt : Called patient, NA, LMAM

## 2021-12-22 ENCOUNTER — Telehealth: Payer: Self-pay | Admitting: Student

## 2021-12-22 NOTE — Telephone Encounter (Signed)
I reviewed external records from PCP.  Labs 12/14/2021: ALT 145, AST 135, BUN 30, creatinine 1.62, GFR 38, potassium 4.2, sodium 140 A1c 6.8% TSH 2.39  Given elevated liver enzymes PCP stopped rosuvastatin as well as Zetia and ordered repeat lipid profile testing and recheck LFTs.  We will consider Repatha or Praluent pending repeat lab results.

## 2021-12-27 ENCOUNTER — Encounter: Payer: Self-pay | Admitting: Student

## 2021-12-27 ENCOUNTER — Other Ambulatory Visit: Payer: Self-pay

## 2021-12-27 ENCOUNTER — Ambulatory Visit: Payer: Medicare PPO | Admitting: Student

## 2021-12-27 VITALS — BP 131/53 | HR 66 | Temp 98.0°F | Ht 63.0 in | Wt 243.0 lb

## 2021-12-27 DIAGNOSIS — E7800 Pure hypercholesterolemia, unspecified: Secondary | ICD-10-CM

## 2021-12-27 DIAGNOSIS — I6523 Occlusion and stenosis of bilateral carotid arteries: Secondary | ICD-10-CM

## 2021-12-27 DIAGNOSIS — I10 Essential (primary) hypertension: Secondary | ICD-10-CM

## 2021-12-27 MED ORDER — REPATHA SURECLICK 140 MG/ML ~~LOC~~ SOAJ: 1.0000 mL | SUBCUTANEOUS | 3 refills | Status: AC

## 2021-12-27 NOTE — Progress Notes (Signed)
Primary Physician/Referring:  Carol Ada, MD  Patient ID: Julie [name], female    DOB: August 11, 1956, 65 y.o.   MRN: [id number]  Chief Complaint  Patient presents with    carotid artery stenosis   Follow-up   HPI:    Julie [name]  is a 66 y.o. A[facility] patient with severe anxiety, long-standing history of difficult to control hypertension, orthostatic hypotension due to diabetic autonomic insufficiency, bilateral renal artery stenting for high-grade bilateral renal artery stenosis in September 2016, stage III-IV chronic kidney disease secondary to uncontrolled diabetes mellitus, high-grade asymptomatic left carotid artery stenosis and moderate stenosis of the right carotid artery, has been reluctant to having any surgical procedures for the same, had seen Dr. Trula Slade in the past, obesity, and hyperlipidemia.   Patient presents for 15-monthfollow-up.  Last office visit had ordered CTA of the neck to further evaluate carotid stenosis, however due to poor renal function opted for repeat carotid artery duplex instead.  Carotid duplex revealed progression again of carotid disease, therefore requested that patient have labs done to determine ability to have CTA, unfortunately his labs have not been done.  Patient is feeling well overall without specific complaints today.  Has no formal exercise routine but denies chest pain, dyspnea, symptoms suggestive of TIA or CVA.  Notably since last office visit patient has been seen by PCP who discontinued Zetia and rosuvastatin given elevated LFTs.  Past Medical History:  Diagnosis Date   Abnormal thyroid scan    decreased thyroid   Actinomyces infection    h/o   Anovulation    chronic   Encounter for IUD insertion 09/15/2010 and 09/17/2005   Mirena   Encounter for IUD insertion 06/02/1990   Paraguard   H/O amenorrhea    Hx of elevated lipids    Hypertension    Hypothyroidism    Obesity    Oligomenorrhea    h/o   PCOS (polycystic ovarian  syndrome)    Type 2 diabetes mellitus [place]    Past Surgical History:  Procedure Laterality Date   PERIPHERAL VASCULAR CATHETERIZATION N/A 07/05/2015   Procedure: Renal Angiography;  Surgeon: JAdrian Prows MD;  [facility];  Service: Cardiovascular;  Laterality: N/A;   PERIPHERAL VASCULAR CATHETERIZATION Bilateral 07/05/2015   Procedure: Renal Intervention;  Surgeon: JAdrian Prows MD;  [facility];  Service: Cardiovascular;  Laterality: Bilateral;  right and left renal stent placement   PERIPHERAL VASCULAR CATHETERIZATION N/A 12/16/2015   Procedure: Renal Angiography;  Surgeon: JAdrian Prows MD;  [facility];  Service: Cardiovascular;  Laterality: N/A;   PERIPHERAL VASCULAR CATHETERIZATION Left 12/16/2015   Procedure: Carotid Angiography/Bilateral;  Surgeon: JAdrian Prows MD;  [facility];  Service: Cardiovascular;  Laterality: Left;   PERIPHERAL VASCULAR CATHETERIZATION  12/16/2015   Procedure: Peripheral Vascular Balloon Angioplasty;  Surgeon: JAdrian Prows MD;  [facility];  Service: Cardiovascular;;  both sides   THYROID EXPLORATION     PT STATES HAD PROCEDURE DONE ON THYROID   WISDOM TOOTH EXTRACTION  1976   Family History  Problem Relation Age of Onset   Hypertension Mother    Diabetes Mother    Diabetes Father    Hypertension Maternal Grandmother    Hypertension Paternal Grandmother     Social History   Tobacco Use   Smoking status: Never   Smokeless tobacco: Never  Substance Use Topics   Alcohol use: No   ROS  Review of  Systems  Cardiovascular:  Positive for dyspnea on exertion (chronic, stable). Negative for chest pain, claudication, leg swelling, near-syncope, orthopnea, palpitations, paroxysmal nocturnal dyspnea and syncope.  Neurological:  Negative for dizziness.  Psychiatric/Behavioral:  The patient is nervous/anxious.   Objective  Blood pressure (!) 131/53, pulse 66, temperature 98 F (36.7 C),  temperature source Temporal, height '5\' 3"'  (1.6 m), weight 243 lb (110.2 kg), SpO2 100 %.  Vitals with BMI 12/27/2021 09/19/2021 06/19/2021  Height '5\' 3"'  '5\' 3"'  '5\' 3"'   Weight 243 lbs 242 lbs 240 lbs 6 oz  BMI 43.06 95.63 87.5  Systolic 643 329 518  Diastolic 53 55 67  Pulse 66 69 71   Physical Exam Vitals reviewed.  Constitutional:      Appearance: She is well-developed. She is obese.  Cardiovascular:     Rate and Rhythm: Normal rate and regular rhythm.     Pulses: Intact distal pulses.          Carotid pulses are  on the right side with bruit and  on the left side with bruit.      Dorsalis pedis pulses are 2+ on the right side and 2+ on the left side.       Posterior tibial pulses are 1+ on the right side and 1+ on the left side.     Heart sounds: S1 normal and S2 normal. Murmur heard.  Midsystolic murmur is present with a grade of 3/6 at the upper right sternal border and apex.    No gallop.     Comments: No JVD Pulmonary:     Effort: Pulmonary effort is normal. No accessory muscle usage or respiratory distress.     Breath sounds: Normal breath sounds. No wheezing, rhonchi or rales.  Abdominal:     Comments: obese  Musculoskeletal:     Right lower leg: No edema.     Left lower leg: No edema.   Laboratory examination:   External labs:  12/14/2021: ALT 145, AST 135, BUN 30, creatinine 1.62, GFR 38, potassium 4.2, sodium 140 A1c 6.8% TSH 2.39  06/13/2021: Sodium 141, potassium 3.6, BUN 30, creatinine 1.8, GFR 34 Total cholesterol 131, triglycerides 80, HDL 56, LDL 59 TSH 1.15  12/13/2020: Hb 11.8/HCT 36.3, platelets 148. Serum glucose 165 mg, BUN 33, creatinine 1.74, EGFR 36 mL, potassium 4.1.  LFTs mildly abnormal with AST 53, ALT 64. Total cholesterol 130, triglycerides 89, HDL 48, LDL 65.  TSH normal. A1c 12/14/2020: 7.0%.  04/13/2020 LDL-C 76.000 Triglycerides 80.000  TSH 0.171  Cholesterol, total 137.000 M 02/05/20 HDL 50.000 MG 10/01/19  Allergies   Allergies   Allergen Reactions   Sulfa Antibiotics Rash and Other (See Comments)    Wheezing   Glipizide     Other reaction(s): low BS/jittery   Maxzide [Triamterene-Hctz]     Other reaction(s): Unknown   Metoprolol Tartrate Other (See Comments)   Zocor [Simvastatin]     Other reaction(s): itching    Medications Prior to Visit:   Outpatient Medications Prior to Visit  Medication Sig Dispense Refill   amLODipine (NORVASC) 10 MG tablet TAKE 1 TABLET BY MOUTH DAILY 90 tablet 2   buPROPion (WELLBUTRIN SR) 150 MG 12 hr tablet TAKE 1 TABLET BY MOUTH TWICE DAILY 60 tablet 3   carvedilol (COREG) 25 MG tablet TAKE 1 TABLET(25 MG) BY MOUTH TWICE DAILY WITH A MEAL 180 tablet 3   chlorthalidone (HYGROTON) 25 MG tablet TAKE 1 TABLET BY MOUTH EVERY MORNING 90 tablet 1   cloNIDine (  CATAPRES) 0.1 MG tablet Take 0.1 mg by mouth daily.     clopidogrel (PLAVIX) 75 MG tablet TAKE 1 TABLET BY MOUTH DAILY 90 tablet 1   ergocalciferol (VITAMIN D2) 50000 UNITS capsule Take 50,000 Units by mouth. 1 every 2 weeks     hydrALAZINE (APRESOLINE) 100 MG tablet Take 100 mg by mouth 2 (two) times daily.     Levothyroxine Sodium 150 MCG CAPS Take 150 mcg by mouth daily.      Potassium Chloride ER 20 MEQ TBCR Take 1 tablet by mouth 2 (two) times daily.     insulin aspart (NOVOLOG) 100 UNIT/ML injection See admin instructions.     tirzepatide [facility]) 2.5 MG/0.5ML Pen 2.5 mg once a week. (Patient not taking: Reported on 12/27/2021)     No facility-administered medications prior to visit.   Final Medications at End of Visit    Current Meds  Medication Sig   amLODipine (NORVASC) 10 MG tablet TAKE 1 TABLET BY MOUTH DAILY   buPROPion (WELLBUTRIN SR) 150 MG 12 hr tablet TAKE 1 TABLET BY MOUTH TWICE DAILY   carvedilol (COREG) 25 MG tablet TAKE 1 TABLET(25 MG) BY MOUTH TWICE DAILY WITH A MEAL   chlorthalidone (HYGROTON) 25 MG tablet TAKE 1 TABLET BY MOUTH EVERY MORNING   cloNIDine (CATAPRES) 0.1 MG tablet Take 0.1 mg by mouth  daily.   clopidogrel (PLAVIX) 75 MG tablet TAKE 1 TABLET BY MOUTH DAILY   ergocalciferol (VITAMIN D2) 50000 UNITS capsule Take 50,000 Units by mouth. 1 every 2 weeks   Evolocumab (REPATHA SURECLICK) 165 MG/ML SOAJ Inject 1 mL into the skin every 14 (fourteen) days.   hydrALAZINE (APRESOLINE) 100 MG tablet Take 100 mg by mouth 2 (two) times daily.   Levothyroxine Sodium 150 MCG CAPS Take 150 mcg by mouth daily.    Potassium Chloride ER 20 MEQ TBCR Take 1 tablet by mouth 2 (two) times daily.    Radiology:   Ultrasound of the kidneys 07/25/2018: No acute abnormality in the kidneys.  Cardiac Studies:   Lexiscan myoview stress test 05/20/2015:  1. The resting electrocardiogram demonstrated normal sinus rhythm, incomplete RBBB and no resting arrhythmias.  Stress EKG is non-diagnostic for ischemia as it a pharmacologic stress using Lexiscan.  2. Myocardial perfusion imaging is normal. Overall left ventricular systolic function was normal without regional wall motion abnormalities. The left ventricular ejection fraction was 64%.  Renal artery duplex 04/24/2016: No evidence of renal artery occlusive disease in either renal artery. Study suggests patency of bilateral renal stents. Compared to 11/08/2015, right renal artery restenosis no longer present. Normal intrarenal vascular perfusion is noted in both kidneys. Diffuse plaque noted in the abdominal aorta. Clinical correlation is suggested.  Echocardiogram 12/26/2020:    Normal LV systolic function with EF 57%. Left ventricle cavity is normal in size. Moderate concentric hypertrophy of the left ventricle. Normal global wall motion. Normal diastolic filling pattern. Calculated EF 57%.  Structurally normal tricuspid valve.  Mild tricuspid regurgitation. No evidence of pulmonary hypertension.  No significant change from 05/31/2015.  Carotid artery duplex 12/13/2021:  Duplex suggests stenosis in the right distal internal carotid artery  (total  occlusion). Right proximal ICA stenosis of >70%.  Stenosis in the left internal carotid artery (>=70%). Duplex suggests  stenosis in the left external carotid artery (>50%).  The PSV internal/common carotid artery ratio of 8.68 on right and  6.05 on  the left is consistent with a stenosis of >70% bilaterally.  Antegrade right vertebral artery flow. Antegrade  left vertebral artery flow.  Compared to the study done on 06/02/2021, occlusion of the right distal ICA is new.  Clinical correlation is recommended.  Consider CT angiogram if clinically indicated. Follow up in four months is appropriate if clinically indicated.  EKG  12/27/2021: Sinus rhythm at a rate of 64 bpm.  Right axis.  Left atrial enlargement.  Incomplete right bundle branch block.  No evidence of ischemia or underlying injury pattern.  Consider pulmonary disease pattern.  Compared EKG 06/19/2021, no significant change.  12/19/2020: Normal sinus rhythm at rate of 68 bpm, left atrial enlargement, right axis deviation, left posterior fascicular block.  Incomplete right bundle branch block.  No evidence of ischemia.  -possible pulmonary disease.  Compared to 06/20/2020, rightward axis is new.   Assessment     ICD-10-CM   1. Asymptomatic carotid artery stenosis, bilateral  I65.23 EKG 12-Lead    PCV CAROTID DUPLEX (BILATERAL)    Lipoprotein A (LPA)    2. Essential hypertension  I10 EKG 12-Lead    3. Hypercholesteremia  E78.00        Meds ordered this encounter  Medications   Evolocumab (REPATHA SURECLICK) 802 MG/ML SOAJ    Sig: Inject 1 mL into the skin every 14 (fourteen) days.    Dispense:  2 mL    Refill:  3     There are no discontinued medications.  Orders Placed This Encounter  Procedures   Lipoprotein A (LPA)   EKG 12-Lead      Recommendations:   Julie [name]  is a 66 y.o. AAF patient with severe anxiety, long-standing history of difficult to control hypertension, orthostatic hypotension due to diabetic  autonomic insufficiency, bilateral renal artery stenting for high-grade bilateral renal artery stenosis in September 2016, stage III-IV chronic kidney disease secondary to uncontrolled diabetes mellitus, high-grade asymptomatic left carotid artery stenosis and moderate stenosis of the right carotid artery, has been reluctant to having any surgical procedures for the same, had seen Dr. Trula Slade in the past, obesity, and hyperlipidemia.  Patient presents for 17-monthfollow-up.  Last office visit had ordered CTA of the neck to further evaluate carotid stenosis, however due to poor renal function opted for repeat carotid artery duplex instead.  Carotid duplex revealed progression again of carotid disease, therefore requested that patient have labs done to determine ability to have CTA, unfortunately his labs have not been done.  Have encouraged patient to have lipoprotein a lab done as well as BMP.  Reviewed and discussed results of carotid artery duplex with Dr. GEinar Gip  Feel patient is not a candidate for carotid endarterectomy given significant soft plaque burden and risk of embolic event.  We will therefore hold off on CTA of the neck and instead continue with regular carotid artery duplex, will repeat surveillance in 4 months.  Reviewed and discussed results of most recent carotid artery duplex with patient.  Last lipid profile testing revealed LDL of 59.  However patient has recently discontinued rosuvastatin and Zetia at the guidance of her PCP given elevated LFTs.  Given significant carotid artery disease we will start patient on Repatha with LDL goal <70, and closer to 55 is better.  Follow-up in 6 months, sooner if needed.   CAlethia Berthold PA-C 12/27/2021, 11:48 AM Office: [phone]

## 2022-01-21 ENCOUNTER — Other Ambulatory Visit: Payer: Self-pay | Admitting: Student

## 2022-03-01 DIAGNOSIS — F419 Anxiety disorder, unspecified: Secondary | ICD-10-CM | POA: Diagnosis not present

## 2022-03-01 DIAGNOSIS — F329 Major depressive disorder, single episode, unspecified: Secondary | ICD-10-CM | POA: Diagnosis not present

## 2022-03-29 ENCOUNTER — Other Ambulatory Visit: Payer: Self-pay | Admitting: Cardiology

## 2022-03-29 DIAGNOSIS — I10 Essential (primary) hypertension: Secondary | ICD-10-CM

## 2022-04-09 DIAGNOSIS — H903 Sensorineural hearing loss, bilateral: Secondary | ICD-10-CM | POA: Diagnosis not present

## 2022-04-11 DIAGNOSIS — E119 Type 2 diabetes mellitus without complications: Secondary | ICD-10-CM | POA: Diagnosis not present

## 2022-04-17 DIAGNOSIS — H903 Sensorineural hearing loss, bilateral: Secondary | ICD-10-CM | POA: Diagnosis not present

## 2022-04-20 ENCOUNTER — Other Ambulatory Visit: Payer: Self-pay | Admitting: Cardiology

## 2022-04-20 DIAGNOSIS — I701 Atherosclerosis of renal artery: Secondary | ICD-10-CM

## 2022-04-24 ENCOUNTER — Ambulatory Visit: Payer: Medicare PPO

## 2022-04-24 DIAGNOSIS — I6523 Occlusion and stenosis of bilateral carotid arteries: Secondary | ICD-10-CM

## 2022-04-27 ENCOUNTER — Other Ambulatory Visit: Payer: Medicare PPO

## 2022-05-02 DIAGNOSIS — F419 Anxiety disorder, unspecified: Secondary | ICD-10-CM | POA: Diagnosis not present

## 2022-05-02 DIAGNOSIS — F329 Major depressive disorder, single episode, unspecified: Secondary | ICD-10-CM | POA: Diagnosis not present

## 2022-06-20 DIAGNOSIS — E559 Vitamin D deficiency, unspecified: Secondary | ICD-10-CM | POA: Diagnosis not present

## 2022-06-20 DIAGNOSIS — E039 Hypothyroidism, unspecified: Secondary | ICD-10-CM | POA: Diagnosis not present

## 2022-06-20 DIAGNOSIS — E1165 Type 2 diabetes mellitus with hyperglycemia: Secondary | ICD-10-CM | POA: Diagnosis not present

## 2022-06-20 DIAGNOSIS — E7800 Pure hypercholesterolemia, unspecified: Secondary | ICD-10-CM | POA: Diagnosis not present

## 2022-06-27 DIAGNOSIS — I10 Essential (primary) hypertension: Secondary | ICD-10-CM | POA: Diagnosis not present

## 2022-06-27 DIAGNOSIS — E1165 Type 2 diabetes mellitus with hyperglycemia: Secondary | ICD-10-CM | POA: Diagnosis not present

## 2022-06-27 DIAGNOSIS — N189 Chronic kidney disease, unspecified: Secondary | ICD-10-CM | POA: Diagnosis not present

## 2022-06-27 DIAGNOSIS — R748 Abnormal levels of other serum enzymes: Secondary | ICD-10-CM | POA: Diagnosis not present

## 2022-06-27 DIAGNOSIS — F419 Anxiety disorder, unspecified: Secondary | ICD-10-CM | POA: Diagnosis not present

## 2022-06-27 DIAGNOSIS — E7800 Pure hypercholesterolemia, unspecified: Secondary | ICD-10-CM | POA: Diagnosis not present

## 2022-06-27 DIAGNOSIS — E559 Vitamin D deficiency, unspecified: Secondary | ICD-10-CM | POA: Diagnosis not present

## 2022-06-27 DIAGNOSIS — E039 Hypothyroidism, unspecified: Secondary | ICD-10-CM | POA: Diagnosis not present

## 2022-06-29 ENCOUNTER — Ambulatory Visit: Payer: Medicare PPO | Admitting: Student

## 2022-06-29 ENCOUNTER — Ambulatory Visit: Payer: Medicare PPO | Admitting: Internal Medicine

## 2022-06-29 ENCOUNTER — Encounter: Payer: Self-pay | Admitting: Internal Medicine

## 2022-06-29 VITALS — BP 184/73 | HR 63 | Temp 97.8°F | Resp 16 | Ht 63.0 in | Wt 245.0 lb

## 2022-06-29 DIAGNOSIS — I6523 Occlusion and stenosis of bilateral carotid arteries: Secondary | ICD-10-CM | POA: Diagnosis not present

## 2022-06-29 DIAGNOSIS — I10 Essential (primary) hypertension: Secondary | ICD-10-CM

## 2022-06-29 DIAGNOSIS — E1165 Type 2 diabetes mellitus with hyperglycemia: Secondary | ICD-10-CM | POA: Diagnosis not present

## 2022-06-29 DIAGNOSIS — E7800 Pure hypercholesterolemia, unspecified: Secondary | ICD-10-CM | POA: Insufficient documentation

## 2022-06-29 MED ORDER — HYDRALAZINE HCL 50 MG PO TABS: 100.0000 mg | ORAL_TABLET | Freq: Three times a day (TID) | ORAL | 2 refills | Status: DC

## 2022-06-29 MED ORDER — CLONIDINE HCL 0.1 MG PO TABS: 0.1000 mg | ORAL_TABLET | Freq: Two times a day (BID) | ORAL | 11 refills | Status: AC

## 2022-06-29 NOTE — Progress Notes (Signed)
Primary Physician/Referring:  Carol Ada, MD  Patient ID: Julie [name], female    DOB: 1956-04-04, 66 y.o.   MRN: [id number]  No chief complaint on file.  HPI:    Julie [name]  is a 66 y.o. AAF patient with severe anxiety, long-standing history of difficult to control hypertension, orthostatic hypotension due to diabetic autonomic insufficiency, bilateral renal artery stenting for high-grade bilateral renal artery stenosis in September 2016, stage III-IV chronic kidney disease secondary to uncontrolled diabetes mellitus, high-grade asymptomatic left carotid artery stenosis and moderate stenosis of the right carotid artery, has been reluctant to having any surgical procedures for the same, had seen Dr. Trula Slade in the past, obesity, and hyperlipidemia.   Her last carotid ultrasound is unchanged from prior, will repeat again around December. Patient agreeable to repeating echocardiogram as her blood pressure is quite high this visit. Otherwise, she denies chest pain, shortness of breath, palpitations, diaphoresis, syncope.  Past Medical History:  Diagnosis Date   Abnormal thyroid scan    decreased thyroid   Actinomyces infection    h/o   Anovulation    chronic   Encounter for IUD insertion 09/15/2010 and 09/17/2005   Mirena   Encounter for IUD insertion 06/02/1990   Paraguard   H/O amenorrhea    Hx of elevated lipids    Hypertension    Hypothyroidism    Obesity    Oligomenorrhea    h/o   PCOS (polycystic ovarian syndrome)    Type 2 diabetes mellitus ([place])    Past Surgical History:  Procedure Laterality Date   PERIPHERAL VASCULAR CATHETERIZATION N/A 07/05/2015   Procedure: Renal Angiography;  Surgeon: Adrian Prows, MD;  Location: [place] [facility];  Service: Cardiovascular;  Laterality: N/A;   PERIPHERAL VASCULAR CATHETERIZATION Bilateral 07/05/2015   Procedure: Renal Intervention;  Surgeon: Adrian Prows, MD;  Location: [place] [facility];  Service: Cardiovascular;  Laterality:  Bilateral;  right and left renal stent placement   PERIPHERAL VASCULAR CATHETERIZATION N/A 12/16/2015   Procedure: Renal Angiography;  Surgeon: Adrian Prows, MD;  Location: [place] [facility];  Service: Cardiovascular;  Laterality: N/A;   PERIPHERAL VASCULAR CATHETERIZATION Left 12/16/2015   Procedure: Carotid Angiography/Bilateral;  Surgeon: Adrian Prows, MD;  Location: [place] [facility];  Service: Cardiovascular;  Laterality: Left;   PERIPHERAL VASCULAR CATHETERIZATION  12/16/2015   Procedure: Peripheral Vascular Balloon Angioplasty;  Surgeon: Adrian Prows, MD;  Location: [facility] [facility];  Service: Cardiovascular;;  both sides   THYROID EXPLORATION     PT STATES HAD PROCEDURE DONE ON THYROID   WISDOM TOOTH EXTRACTION  1976   Family History  Problem Relation Age of Onset   Hypertension Mother    Diabetes Mother    Diabetes Father    Hypertension Maternal Grandmother    Hypertension Paternal Grandmother     Social History   Tobacco Use   Smoking status: Never   Smokeless tobacco: Never  Substance Use Topics   Alcohol use: No   ROS  Review of Systems  Cardiovascular:  Negative for chest pain, claudication, dyspnea on exertion (chronic, stable), leg swelling, near-syncope, orthopnea, palpitations, paroxysmal nocturnal dyspnea and syncope.  Neurological:  Negative for dizziness.  Psychiatric/Behavioral:  The patient is not nervous/anxious.    Objective  Blood pressure (!) 184/73, pulse 63, temperature 97.8 F (36.6 C), temperature source Temporal, resp. rate 16, height '5\' 3"'  (1.6 m), weight 245 lb (111.1 kg), SpO2 99 %.     06/29/2022    9:40  AM 12/27/2021    9:28 AM 09/19/2021    9:52 AM  Vitals with BMI  Height '5\' 3"'  '5\' 3"'  '5\' 3"'   Weight 245 lbs 243 lbs 242 lbs  BMI 43.41 55.73 22.02  Systolic 542 706 237  Diastolic 73 53 55  Pulse 63 66 69   Physical Exam Vitals and nursing note reviewed.  Constitutional:      Appearance: She is well-developed. She is obese.  HENT:     Head:  Normocephalic and atraumatic.  Cardiovascular:     Rate and Rhythm: Normal rate and regular rhythm.     Pulses: Intact distal pulses.          Carotid pulses are  on the right side with bruit and  on the left side with bruit.      Dorsalis pedis pulses are 2+ on the right side and 2+ on the left side.       Posterior tibial pulses are 1+ on the right side and 1+ on the left side.     Heart sounds: S1 normal and S2 normal. Murmur heard.     Midsystolic murmur is present with a grade of 3/6 at the upper right sternal border and apex.     No gallop.     Comments: No JVD Pulmonary:     Effort: Pulmonary effort is normal. No accessory muscle usage or respiratory distress.     Breath sounds: Normal breath sounds. No wheezing, rhonchi or rales.  Abdominal:     General: Bowel sounds are normal.     Palpations: Abdomen is soft.     Comments: obese  Musculoskeletal:     Right lower leg: No edema.     Left lower leg: No edema.  Skin:    General: Skin is warm and dry.  Neurological:     Mental Status: She is alert.    Laboratory examination:   External labs:  12/14/2021: ALT 145, AST 135, BUN 30, creatinine 1.62, GFR 38, potassium 4.2, sodium 140 A1c 6.8% TSH 2.39  06/13/2021: Sodium 141, potassium 3.6, BUN 30, creatinine 1.8, GFR 34 Total cholesterol 131, triglycerides 80, HDL 56, LDL 59 TSH 1.15  12/13/2020: Hb 11.8/HCT 36.3, platelets 148. Serum glucose 165 mg, BUN 33, creatinine 1.74, EGFR 36 mL, potassium 4.1.  LFTs mildly abnormal with AST 53, ALT 64. Total cholesterol 130, triglycerides 89, HDL 48, LDL 65.  TSH normal. A1c 12/14/2020: 7.0%.  04/13/2020 LDL-C 76.000 Triglycerides 80.000  TSH 0.171  Cholesterol, total 137.000 M 02/05/20 HDL 50.000 MG 10/01/19  Allergies   Allergies  Allergen Reactions   Sulfa Antibiotics Rash and Other (See Comments)    Wheezing   Glipizide     Other reaction(s): low BS/jittery   Maxzide [Triamterene-Hctz]     Other reaction(s):  Unknown   Metoprolol Tartrate Other (See Comments)   Zocor [Simvastatin]     Other reaction(s): itching    Medications Prior to Visit:   Outpatient Medications Prior to Visit  Medication Sig Dispense Refill   amLODipine (NORVASC) 10 MG tablet TAKE 1 TABLET BY MOUTH DAILY 90 tablet 2   aspirin EC 81 MG tablet Take 81 mg by mouth daily. Swallow whole.     buPROPion (WELLBUTRIN SR) 150 MG 12 hr tablet TAKE 1 TABLET BY MOUTH TWICE DAILY 60 tablet 3   carvedilol (COREG) 25 MG tablet TAKE 1 TABLET(25 MG) BY MOUTH TWICE DAILY WITH A MEAL 180 tablet 3   chlorthalidone (HYGROTON) 25 MG tablet TAKE  1 TABLET BY MOUTH EVERY MORNING 90 tablet 1   clopidogrel (PLAVIX) 75 MG tablet TAKE 1 TABLET BY MOUTH DAILY 90 tablet 1   ergocalciferol (VITAMIN D2) 50000 UNITS capsule Take 50,000 Units by mouth. 1 every 2 weeks     Evolocumab (REPATHA SURECLICK) 898 MG/ML SOAJ Inject 1 mL into the skin every 14 (fourteen) days. 2 mL 3   Levothyroxine Sodium 150 MCG CAPS Take 150 mcg by mouth daily.      Potassium Chloride ER 20 MEQ TBCR Take 1 tablet by mouth 2 (two) times daily.     rosuvastatin (CRESTOR) 10 MG tablet 1 tablet Orally Once a day for 30 day(s)     cloNIDine (CATAPRES) 0.1 MG tablet Take 0.1 mg by mouth daily.     hydrALAZINE (APRESOLINE) 100 MG tablet Take 100 mg by mouth 2 (two) times daily.     insulin aspart (NOVOLOG) 100 UNIT/ML injection See admin instructions. (Patient not taking: Reported on 06/29/2022)     tirzepatide [facility]) 2.5 MG/0.5ML Pen 2.5 mg once a week. (Patient not taking: Reported on 12/27/2021)     ZIOPTAN 0.0015 % SOLN SMARTSIG:1 Drop(s) In Eye(s) Every Evening     No facility-administered medications prior to visit.   Final Medications at End of Visit    Current Meds  Medication Sig   amLODipine (NORVASC) 10 MG tablet TAKE 1 TABLET BY MOUTH DAILY   aspirin EC 81 MG tablet Take 81 mg by mouth daily. Swallow whole.   buPROPion (WELLBUTRIN SR) 150 MG 12 hr tablet TAKE 1 TABLET  BY MOUTH TWICE DAILY   carvedilol (COREG) 25 MG tablet TAKE 1 TABLET(25 MG) BY MOUTH TWICE DAILY WITH A MEAL   chlorthalidone (HYGROTON) 25 MG tablet TAKE 1 TABLET BY MOUTH EVERY MORNING   cloNIDine (CATAPRES) 0.1 MG tablet Take 1 tablet (0.1 mg total) by mouth 2 (two) times daily.   clopidogrel (PLAVIX) 75 MG tablet TAKE 1 TABLET BY MOUTH DAILY   ergocalciferol (VITAMIN D2) 50000 UNITS capsule Take 50,000 Units by mouth. 1 every 2 weeks   Evolocumab (REPATHA SURECLICK) 421 MG/ML SOAJ Inject 1 mL into the skin every 14 (fourteen) days.   hydrALAZINE (APRESOLINE) 50 MG tablet Take 2 tablets (100 mg total) by mouth 3 (three) times daily.   Levothyroxine Sodium 150 MCG CAPS Take 150 mcg by mouth daily.    Potassium Chloride ER 20 MEQ TBCR Take 1 tablet by mouth 2 (two) times daily.   rosuvastatin (CRESTOR) 10 MG tablet 1 tablet Orally Once a day for 30 day(s)   [DISCONTINUED] cloNIDine (CATAPRES) 0.1 MG tablet Take 0.1 mg by mouth daily.   [DISCONTINUED] hydrALAZINE (APRESOLINE) 100 MG tablet Take 100 mg by mouth 2 (two) times daily.    Radiology:   Ultrasound of the kidneys 07/25/2018: No acute abnormality in the kidneys.  Cardiac Studies:   Lexiscan myoview stress test 05/20/2015:  1. The resting electrocardiogram demonstrated normal sinus rhythm, incomplete RBBB and no resting arrhythmias.  Stress EKG is non-diagnostic for ischemia as it a pharmacologic stress using Lexiscan.  2. Myocardial perfusion imaging is normal. Overall left ventricular systolic function was normal without regional wall motion abnormalities. The left ventricular ejection fraction was 64%.  Renal artery duplex 04/24/2016: No evidence of renal artery occlusive disease in either renal artery. Study suggests patency of bilateral renal stents. Compared to 11/08/2015, right renal artery restenosis no longer present. Normal intrarenal vascular perfusion is noted in both kidneys. Diffuse plaque noted in the abdominal  aorta.  Clinical correlation is suggested.  Echocardiogram 12/26/2020:    Normal LV systolic function with EF 57%. Left ventricle cavity is normal in size. Moderate concentric hypertrophy of the left ventricle. Normal global wall motion. Normal diastolic filling pattern. Calculated EF 57%.  Structurally normal tricuspid valve.  Mild tricuspid regurgitation. No evidence of pulmonary hypertension.  No significant change from 05/31/2015.  Carotid artery duplex 12/13/2021:  Duplex suggests stenosis in the right distal internal carotid artery  (total occlusion). Right proximal ICA stenosis of >70%.  Stenosis in the left internal carotid artery (>=70%). Duplex suggests  stenosis in the left external carotid artery (>50%).  The PSV internal/common carotid artery ratio of 8.68 on right and  6.05 on  the left is consistent with a stenosis of >70% bilaterally.  Antegrade right vertebral artery flow. Antegrade left vertebral artery flow.  Compared to the study done on 06/02/2021, occlusion of the right distal ICA is new.  Clinical correlation is recommended.  Consider CT angiogram if clinically indicated. Follow up in four months is appropriate if clinically indicated.  EKG  06/29/2022: Sinus rhythm at a rate of 63 bpm. Non-specific T wave changes. Repeating echocardiogram  12/27/2021: Sinus rhythm at a rate of 64 bpm.  Right axis.  Left atrial enlargement.  Incomplete right bundle branch block.  No evidence of ischemia or underlying injury pattern.  Consider pulmonary disease pattern.  Compared EKG 06/19/2021, no significant change.  12/19/2020: Normal sinus rhythm at rate of 68 bpm, left atrial enlargement, right axis deviation, left posterior fascicular block.  Incomplete right bundle branch block.  No evidence of ischemia.  -possible pulmonary disease.  Compared to 06/20/2020, rightward axis is new.   Assessment     ICD-10-CM   1. Essential hypertension  I10 EKG 12-Lead    [facility] ECHOCARDIOGRAM COMPLETE    2.  Hypercholesteremia  E78.00 [facility] ECHOCARDIOGRAM COMPLETE    3. Uncontrolled type 2 diabetes mellitus with hyperglycemia (HCC)  E11.65 [facility] ECHOCARDIOGRAM COMPLETE    4. Asymptomatic carotid artery stenosis, bilateral  I65.23        Meds ordered this encounter  Medications   hydrALAZINE (APRESOLINE) 50 MG tablet    Sig: Take 2 tablets (100 mg total) by mouth 3 (three) times daily.    Dispense:  180 tablet    Refill:  2   cloNIDine (CATAPRES) 0.1 MG tablet    Sig: Take 1 tablet (0.1 mg total) by mouth 2 (two) times daily.    Dispense:  60 tablet    Refill:  11     Medications Discontinued During This Encounter  Medication Reason   hydrALAZINE (APRESOLINE) 100 MG tablet    cloNIDine (CATAPRES) 0.1 MG tablet     Orders Placed This Encounter  Procedures   EKG 12-Lead   [facility] ECHOCARDIOGRAM COMPLETE    Standing Status:   Future    Standing Expiration Date:   06/30/2023      Recommendations:   Julie [name]  is a 66 y.o. AAF patient with severe anxiety, long-standing history of difficult to control hypertension, orthostatic hypotension due to diabetic autonomic insufficiency, bilateral renal artery stenting for high-grade bilateral renal artery stenosis in September 2016, stage III-IV chronic kidney disease secondary to uncontrolled diabetes mellitus, high-grade asymptomatic left carotid artery stenosis and moderate stenosis of the right carotid artery, has been reluctant to having any surgical procedures for the same, had seen Dr. Trula Slade in the past, obesity, and hyperlipidemia.  03/2022 carotid u/s: Carotid artery duplex 04/23/2022:  Duplex suggests stenosis in the right internal carotid artery (80-99%)  with peak velocity of 573/119 cm/s. ICA/CCA ratio of 4.16 suggests >70%  stenosis.  Duplex suggests stenosis in the left internal carotid artery (>=70%) with  peak velocity of 465/96 cm/s. The PSV internal/common carotid artery ratio  of 4.56 is consistent with a stenosis of >70%.   Duplex suggests stenosis in the left common carotid artery (>50%). Duplex  suggests stenosis in the left external carotid artery (<50%).  Compared to the study done on 12/13/2021, there appears to be overall no  significant change.  Distal right ICA occlusion appears to be patent.  Follow up in six months is appropriate if clinically indicated.  Next carotid u/s ordered for around 09/2022   Continue current cardiac medications with the following changes: hydralazine to be taken TID, clonidine to be taken BID as blood pressure is uncontrolled BP monitoring initiated with [facility] pharmacist. Encourage low-sodium diet, less than 2000 mg daily. Schedule imaging tests in office - echo and carotid duplex.   Follow-up in 6 months, sooner if needed.   Floydene Flock, DO, [facility] 06/29/2022, 10:37 AM Office: [phone]

## 2022-07-04 DIAGNOSIS — F419 Anxiety disorder, unspecified: Secondary | ICD-10-CM | POA: Diagnosis not present

## 2022-07-04 DIAGNOSIS — F329 Major depressive disorder, single episode, unspecified: Secondary | ICD-10-CM | POA: Diagnosis not present

## 2022-07-12 DIAGNOSIS — H2513 Age-related nuclear cataract, bilateral: Secondary | ICD-10-CM | POA: Diagnosis not present

## 2022-07-12 DIAGNOSIS — H401132 Primary open-angle glaucoma, bilateral, moderate stage: Secondary | ICD-10-CM | POA: Diagnosis not present

## 2022-07-12 DIAGNOSIS — H43813 Vitreous degeneration, bilateral: Secondary | ICD-10-CM | POA: Diagnosis not present

## 2022-07-12 DIAGNOSIS — E113293 Type 2 diabetes mellitus with mild nonproliferative diabetic retinopathy without macular edema, bilateral: Secondary | ICD-10-CM | POA: Diagnosis not present

## 2022-07-13 ENCOUNTER — Telehealth: Payer: Self-pay

## 2022-07-13 NOTE — Telephone Encounter (Signed)
Patient a new RPM start. 1 week follow up with BP data. Will continue to monitor.   BP Readings:  07/12/2022 Thursday at 06:07 PM 117 / 58      07/11/2022 Wednesday at 04:37 PM 114 / 58      07/09/2022 Monday at 07:53 PM 110 / 53      07/07/2022 Saturday at 09:25 PM 147 / 74      07/02/2022 Monday at 08:51 PM 138 / 75      07/02/2022 Monday at 02:39 PM 132 / 60      07/01/2022 'Sunday at 12:50 PM 124 / 59      09'$ /10/2021 Friday at 10:22 AM 130 / 63

## 2022-08-03 DIAGNOSIS — I10 Essential (primary) hypertension: Secondary | ICD-10-CM | POA: Diagnosis not present

## 2022-08-05 DIAGNOSIS — M25552 Pain in left hip: Secondary | ICD-10-CM | POA: Diagnosis not present

## 2022-08-05 DIAGNOSIS — M79652 Pain in left thigh: Secondary | ICD-10-CM | POA: Diagnosis not present

## 2022-08-05 DIAGNOSIS — M169 Osteoarthritis of hip, unspecified: Secondary | ICD-10-CM | POA: Diagnosis not present

## 2022-08-09 DIAGNOSIS — E039 Hypothyroidism, unspecified: Secondary | ICD-10-CM | POA: Diagnosis not present

## 2022-08-09 DIAGNOSIS — E7800 Pure hypercholesterolemia, unspecified: Secondary | ICD-10-CM | POA: Diagnosis not present

## 2022-08-16 DIAGNOSIS — I10 Essential (primary) hypertension: Secondary | ICD-10-CM | POA: Diagnosis not present

## 2022-08-16 DIAGNOSIS — E7800 Pure hypercholesterolemia, unspecified: Secondary | ICD-10-CM | POA: Diagnosis not present

## 2022-08-16 DIAGNOSIS — E1165 Type 2 diabetes mellitus with hyperglycemia: Secondary | ICD-10-CM | POA: Diagnosis not present

## 2022-08-16 DIAGNOSIS — F419 Anxiety disorder, unspecified: Secondary | ICD-10-CM | POA: Diagnosis not present

## 2022-08-16 DIAGNOSIS — N189 Chronic kidney disease, unspecified: Secondary | ICD-10-CM | POA: Diagnosis not present

## 2022-08-16 DIAGNOSIS — E039 Hypothyroidism, unspecified: Secondary | ICD-10-CM | POA: Diagnosis not present

## 2022-08-16 DIAGNOSIS — E559 Vitamin D deficiency, unspecified: Secondary | ICD-10-CM | POA: Diagnosis not present

## 2022-08-16 DIAGNOSIS — Z23 Encounter for immunization: Secondary | ICD-10-CM | POA: Diagnosis not present

## 2022-08-29 ENCOUNTER — Other Ambulatory Visit: Payer: Self-pay | Admitting: Cardiology

## 2022-08-30 DIAGNOSIS — F329 Major depressive disorder, single episode, unspecified: Secondary | ICD-10-CM | POA: Diagnosis not present

## 2022-08-30 DIAGNOSIS — F419 Anxiety disorder, unspecified: Secondary | ICD-10-CM | POA: Diagnosis not present

## 2022-09-03 DIAGNOSIS — I10 Essential (primary) hypertension: Secondary | ICD-10-CM | POA: Diagnosis not present

## 2022-10-03 DIAGNOSIS — I10 Essential (primary) hypertension: Secondary | ICD-10-CM | POA: Diagnosis not present

## 2022-10-31 DIAGNOSIS — N1832 Chronic kidney disease, stage 3b: Secondary | ICD-10-CM | POA: Diagnosis not present

## 2022-11-01 DIAGNOSIS — F329 Major depressive disorder, single episode, unspecified: Secondary | ICD-10-CM | POA: Diagnosis not present

## 2022-11-01 DIAGNOSIS — F419 Anxiety disorder, unspecified: Secondary | ICD-10-CM | POA: Diagnosis not present

## 2022-11-03 DIAGNOSIS — I10 Essential (primary) hypertension: Secondary | ICD-10-CM | POA: Diagnosis not present

## 2022-11-06 DIAGNOSIS — E876 Hypokalemia: Secondary | ICD-10-CM | POA: Diagnosis not present

## 2022-11-06 DIAGNOSIS — N1832 Chronic kidney disease, stage 3b: Secondary | ICD-10-CM | POA: Diagnosis not present

## 2022-11-06 DIAGNOSIS — I129 Hypertensive chronic kidney disease with stage 1 through stage 4 chronic kidney disease, or unspecified chronic kidney disease: Secondary | ICD-10-CM | POA: Diagnosis not present

## 2022-11-08 DIAGNOSIS — H9191 Unspecified hearing loss, right ear: Secondary | ICD-10-CM | POA: Diagnosis not present

## 2022-11-08 DIAGNOSIS — H6121 Impacted cerumen, right ear: Secondary | ICD-10-CM | POA: Diagnosis not present

## 2022-11-21 DIAGNOSIS — R051 Acute cough: Secondary | ICD-10-CM | POA: Diagnosis not present

## 2022-11-21 DIAGNOSIS — J069 Acute upper respiratory infection, unspecified: Secondary | ICD-10-CM | POA: Diagnosis not present

## 2022-11-21 DIAGNOSIS — B974 Respiratory syncytial virus as the cause of diseases classified elsewhere: Secondary | ICD-10-CM | POA: Diagnosis not present

## 2022-11-21 DIAGNOSIS — Z03818 Encounter for observation for suspected exposure to other biological agents ruled out: Secondary | ICD-10-CM | POA: Diagnosis not present

## 2022-11-21 DIAGNOSIS — R0981 Nasal congestion: Secondary | ICD-10-CM | POA: Diagnosis not present

## 2022-11-23 ENCOUNTER — Other Ambulatory Visit: Payer: Self-pay | Admitting: Cardiology

## 2022-12-04 DIAGNOSIS — I10 Essential (primary) hypertension: Secondary | ICD-10-CM | POA: Diagnosis not present

## 2022-12-13 DIAGNOSIS — E113293 Type 2 diabetes mellitus with mild nonproliferative diabetic retinopathy without macular edema, bilateral: Secondary | ICD-10-CM | POA: Diagnosis not present

## 2022-12-13 DIAGNOSIS — H401132 Primary open-angle glaucoma, bilateral, moderate stage: Secondary | ICD-10-CM | POA: Diagnosis not present

## 2022-12-13 DIAGNOSIS — H2513 Age-related nuclear cataract, bilateral: Secondary | ICD-10-CM | POA: Diagnosis not present

## 2022-12-13 DIAGNOSIS — H43813 Vitreous degeneration, bilateral: Secondary | ICD-10-CM | POA: Diagnosis not present

## 2022-12-17 DIAGNOSIS — F419 Anxiety disorder, unspecified: Secondary | ICD-10-CM | POA: Diagnosis not present

## 2022-12-17 DIAGNOSIS — R748 Abnormal levels of other serum enzymes: Secondary | ICD-10-CM | POA: Diagnosis not present

## 2022-12-17 DIAGNOSIS — E039 Hypothyroidism, unspecified: Secondary | ICD-10-CM | POA: Diagnosis not present

## 2022-12-17 DIAGNOSIS — I10 Essential (primary) hypertension: Secondary | ICD-10-CM | POA: Diagnosis not present

## 2022-12-17 DIAGNOSIS — E559 Vitamin D deficiency, unspecified: Secondary | ICD-10-CM | POA: Diagnosis not present

## 2022-12-17 DIAGNOSIS — E7800 Pure hypercholesterolemia, unspecified: Secondary | ICD-10-CM | POA: Diagnosis not present

## 2022-12-17 DIAGNOSIS — E1165 Type 2 diabetes mellitus with hyperglycemia: Secondary | ICD-10-CM | POA: Diagnosis not present

## 2022-12-17 DIAGNOSIS — N189 Chronic kidney disease, unspecified: Secondary | ICD-10-CM | POA: Diagnosis not present

## 2022-12-19 ENCOUNTER — Other Ambulatory Visit: Payer: Medicare PPO

## 2022-12-24 ENCOUNTER — Other Ambulatory Visit: Payer: Medicare PPO

## 2022-12-24 ENCOUNTER — Ambulatory Visit: Payer: Medicare PPO

## 2022-12-24 DIAGNOSIS — I6523 Occlusion and stenosis of bilateral carotid arteries: Secondary | ICD-10-CM

## 2022-12-28 ENCOUNTER — Other Ambulatory Visit: Payer: Self-pay | Admitting: Cardiology

## 2022-12-28 DIAGNOSIS — I701 Atherosclerosis of renal artery: Secondary | ICD-10-CM

## 2022-12-31 ENCOUNTER — Ambulatory Visit: Payer: Medicare PPO | Admitting: Internal Medicine

## 2022-12-31 ENCOUNTER — Other Ambulatory Visit: Payer: Self-pay

## 2022-12-31 ENCOUNTER — Telehealth: Payer: Self-pay

## 2022-12-31 ENCOUNTER — Encounter: Payer: Self-pay | Admitting: Internal Medicine

## 2022-12-31 VITALS — BP 143/61 | HR 79 | Ht 63.0 in | Wt 232.6 lb

## 2022-12-31 DIAGNOSIS — I10 Essential (primary) hypertension: Secondary | ICD-10-CM | POA: Diagnosis not present

## 2022-12-31 DIAGNOSIS — E7800 Pure hypercholesterolemia, unspecified: Secondary | ICD-10-CM

## 2022-12-31 DIAGNOSIS — I701 Atherosclerosis of renal artery: Secondary | ICD-10-CM

## 2022-12-31 DIAGNOSIS — I6523 Occlusion and stenosis of bilateral carotid arteries: Secondary | ICD-10-CM | POA: Diagnosis not present

## 2022-12-31 MED ORDER — BUPROPION HCL ER (SR) 150 MG PO TB12: 150.0000 mg | ORAL_TABLET | Freq: Two times a day (BID) | ORAL | 3 refills | Status: DC

## 2022-12-31 NOTE — Progress Notes (Signed)
Primary Physician/Referring:  Carol Ada, MD  Patient ID: Julie [name], female    DOB: 1956-10-01, 67 y.o.   MRN: [id number]  Chief Complaint  Patient presents with   Hypertension   Follow-up   Medication Refill    HPI:    Julie [name]  is a 67 y.o. AAF patient with severe anxiety, long-standing history of difficult to control hypertension, orthostatic hypotension due to diabetic autonomic insufficiency, bilateral renal artery stenting for high-grade bilateral renal artery stenosis in September 2016, stage III-IV chronic kidney disease secondary to uncontrolled diabetes mellitus, high-grade asymptomatic left carotid artery stenosis and moderate stenosis of the right carotid artery, has been reluctant to having any surgical procedures for the same, had seen Dr. Trula Slade in the past, obesity, and hyperlipidemia.   Patient presents today for follow-up visit. She has been doing great since the last time she was here. Her blood pressure is beautifully controlled on this regiment as seen through our RPM program. Her carotid stenosis has actually gotten a little better. Patient is trying to stay active and take care of herself. She is pleased with her numbers and test results. Patient has no complaints or concerns today. She denies chest pain, shortness of breath, palpitations, diaphoresis, syncope.  Past Medical History:  Diagnosis Date   Abnormal thyroid scan    decreased thyroid   Actinomyces infection    h/o   Anovulation    chronic   Encounter for IUD insertion 09/15/2010 and 09/17/2005   Mirena   Encounter for IUD insertion 06/02/1990   Paraguard   H/O amenorrhea    Hx of elevated lipids    Hypertension    Hypothyroidism    Obesity    Oligomenorrhea    h/o   PCOS (polycystic ovarian syndrome)    Type 2 diabetes mellitus ([place])    Past Surgical History:  Procedure Laterality Date   PERIPHERAL VASCULAR CATHETERIZATION N/A 07/05/2015   Procedure: Renal Angiography;   Surgeon: Adrian Prows, MD;  Location: [facility];  Service: Cardiovascular;  Laterality: N/A;   PERIPHERAL VASCULAR CATHETERIZATION Bilateral 07/05/2015   Procedure: Renal Intervention;  Surgeon: Adrian Prows, MD;  Location: [facility];  Service: Cardiovascular;  Laterality: Bilateral;  right and left renal stent placement   PERIPHERAL VASCULAR CATHETERIZATION N/A 12/16/2015   Procedure: Renal Angiography;  Surgeon: Adrian Prows, MD;  Location: [facility];  Service: Cardiovascular;  Laterality: N/A;   PERIPHERAL VASCULAR CATHETERIZATION Left 12/16/2015   Procedure: Carotid Angiography/Bilateral;  Surgeon: Adrian Prows, MD;  Location: [facility];  Service: Cardiovascular;  Laterality: Left;   PERIPHERAL VASCULAR CATHETERIZATION  12/16/2015   Procedure: Peripheral Vascular Balloon Angioplasty;  Surgeon: Adrian Prows, MD;  Location: [facility];  Service: Cardiovascular;;  both sides   THYROID EXPLORATION     PT STATES HAD PROCEDURE DONE ON THYROID   WISDOM TOOTH EXTRACTION  1976   Family History  Problem Relation Age of Onset   Hypertension Mother    Diabetes Mother    Diabetes Father    Hypertension Maternal Grandmother    Hypertension Paternal Grandmother     Social History   Tobacco Use   Smoking status: Never   Smokeless tobacco: Never  Substance Use Topics   Alcohol use: No   ROS  Review of Systems  Cardiovascular:  Negative for chest pain, claudication, dyspnea on exertion (chronic, stable), leg swelling, near-syncope, orthopnea, palpitations, paroxysmal nocturnal dyspnea and syncope.  Neurological:  Negative  for dizziness.  Psychiatric/Behavioral:  The patient is not nervous/anxious.    Objective  Blood pressure (!) 143/61, pulse 79, height '5\' 3"'$  (1.6 m), weight 232 lb 9.6 oz (105.5 kg), SpO2 98 %.     12/31/2022    8:49 AM 06/29/2022    9:40 AM 12/27/2021    9:28 AM  Vitals with BMI  Height '5\' 3"'$  '5\' 3"'$  '5\' 3"'$   Weight 232 lbs 10 oz 245 lbs 243 lbs  BMI  41.21 A999333 A999333  Systolic A999333 Q000111Q A999333  Diastolic 61 73 53  Pulse 79 63 66   Physical Exam Vitals and nursing note reviewed.  Constitutional:      Appearance: She is well-developed. She is obese.  HENT:     Head: Normocephalic and atraumatic.  Cardiovascular:     Rate and Rhythm: Normal rate and regular rhythm.     Pulses: Intact distal pulses.          Carotid pulses are  on the right side with bruit and  on the left side with bruit.      Dorsalis pedis pulses are 2+ on the right side and 2+ on the left side.       Posterior tibial pulses are 1+ on the right side and 1+ on the left side.     Heart sounds: S1 normal and S2 normal. Murmur heard.     Midsystolic murmur is present with a grade of 3/6 at the upper right sternal border and apex.     No gallop.     Comments: No JVD Pulmonary:     Effort: Pulmonary effort is normal. No accessory muscle usage or respiratory distress.     Breath sounds: Normal breath sounds. No wheezing, rhonchi or rales.  Abdominal:     General: Bowel sounds are normal.     Palpations: Abdomen is soft.     Comments: obese  Musculoskeletal:     Right lower leg: No edema.     Left lower leg: No edema.  Skin:    General: Skin is warm and dry.  Neurological:     Mental Status: She is alert.    Laboratory examination:   External labs:  12/14/2021: ALT 145, AST 135, BUN 30, creatinine 1.62, GFR 38, potassium 4.2, sodium 140 A1c 6.8% TSH 2.39  06/13/2021: Sodium 141, potassium 3.6, BUN 30, creatinine 1.8, GFR 34 Total cholesterol 131, triglycerides 80, HDL 56, LDL 59 TSH 1.15  12/13/2020: Hb 11.8/HCT 36.3, platelets 148. Serum glucose 165 mg, BUN 33, creatinine 1.74, EGFR 36 mL, potassium 4.1.  LFTs mildly abnormal with AST 53, ALT 64. Total cholesterol 130, triglycerides 89, HDL 48, LDL 65.  TSH normal. A1c 12/14/2020: 7.0%.  04/13/2020 LDL-C 76.000 Triglycerides 80.000  TSH 0.171  Cholesterol, total 137.000 M 02/05/20 HDL 50.000 MG  10/01/19  Allergies   Allergies  Allergen Reactions   Sulfa Antibiotics Rash and Other (See Comments)    Wheezing   Beta Adrenergic Blockers Other (See Comments)   Glipizide     Other reaction(s): low BS/jittery   Maxzide [Triamterene-Hctz]     Other reaction(s): Unknown   Metformin Hcl Other (See Comments)   Metoprolol Tartrate Other (See Comments)   Zocor [Simvastatin]     Other reaction(s): itching    Medications Prior to Visit:   Outpatient Medications Prior to Visit  Medication Sig Dispense Refill   amLODipine (NORVASC) 10 MG tablet TAKE 1 TABLET BY MOUTH DAILY 90 tablet 2   aspirin EC  81 MG tablet Take 81 mg by mouth daily. Swallow whole.     carvedilol (COREG) 25 MG tablet TAKE 1 TABLET(25 MG) BY MOUTH TWICE DAILY WITH A MEAL 180 tablet 3   chlorthalidone (HYGROTON) 25 MG tablet TAKE 1 TABLET BY MOUTH EVERY MORNING 90 tablet 1   cloNIDine (CATAPRES) 0.1 MG tablet Take 1 tablet (0.1 mg total) by mouth 2 (two) times daily. 60 tablet 11   clopidogrel (PLAVIX) 75 MG tablet TAKE 1 TABLET BY MOUTH DAILY 90 tablet 1   ergocalciferol (VITAMIN D2) 50000 UNITS capsule Take 50,000 Units by mouth. 1 every 2 weeks     Evolocumab (REPATHA SURECLICK) [id number] MG/ML SOAJ Inject 1 mL into the skin every 14 (fourteen) days. 2 mL 3   hydrALAZINE (APRESOLINE) 50 MG tablet Take 2 tablets (100 mg total) by mouth 3 (three) times daily. 180 tablet 2   insulin aspart (NOVOLOG) 100 UNIT/ML injection See admin instructions.     Levothyroxine Sodium 150 MCG CAPS Take 150 mcg by mouth daily.      Potassium Chloride ER 20 MEQ TBCR Take 1 tablet by mouth 2 (two) times daily.     rosuvastatin (CRESTOR) 10 MG tablet 1 tablet Orally Once a day for 30 day(s)     tirzepatide (MOUNJARO) 2.5 MG/0.5ML Pen 2.5 mg once a week.     ZIOPTAN 0.0015 % SOLN SMARTSIG:1 Drop(s) In Eye(s) Every Evening     buPROPion (WELLBUTRIN SR) 150 MG 12 hr tablet TAKE 1 TABLET BY MOUTH TWICE DAILY 60 tablet 3   No facility-administered  medications prior to visit.   Final Medications at End of Visit    Current Meds  Medication Sig   amLODipine (NORVASC) 10 MG tablet TAKE 1 TABLET BY MOUTH DAILY   aspirin EC 81 MG tablet Take 81 mg by mouth daily. Swallow whole.   carvedilol (COREG) 25 MG tablet TAKE 1 TABLET(25 MG) BY MOUTH TWICE DAILY WITH A MEAL   chlorthalidone (HYGROTON) 25 MG tablet TAKE 1 TABLET BY MOUTH EVERY MORNING   cloNIDine (CATAPRES) 0.1 MG tablet Take 1 tablet (0.1 mg total) by mouth 2 (two) times daily.   clopidogrel (PLAVIX) 75 MG tablet TAKE 1 TABLET BY MOUTH DAILY   ergocalciferol (VITAMIN D2) 50000 UNITS capsule Take 50,000 Units by mouth. 1 every 2 weeks   Evolocumab (REPATHA SURECLICK) [id number] MG/ML SOAJ Inject 1 mL into the skin every 14 (fourteen) days.   hydrALAZINE (APRESOLINE) 50 MG tablet Take 2 tablets (100 mg total) by mouth 3 (three) times daily.   insulin aspart (NOVOLOG) 100 UNIT/ML injection See admin instructions.   Levothyroxine Sodium 150 MCG CAPS Take 150 mcg by mouth daily.    Potassium Chloride ER 20 MEQ TBCR Take 1 tablet by mouth 2 (two) times daily.   rosuvastatin (CRESTOR) 10 MG tablet 1 tablet Orally Once a day for 30 day(s)   tirzepatide (MOUNJARO) 2.5 MG/0.5ML Pen 2.5 mg once a week.   ZIOPTAN 0.0015 % SOLN SMARTSIG:1 Drop(s) In Eye(s) Every Evening   [DISCONTINUED] buPROPion (WELLBUTRIN SR) 150 MG 12 hr tablet TAKE 1 TABLET BY MOUTH TWICE DAILY    Radiology:   Ultrasound of the kidneys 07/25/2018: No acute abnormality in the kidneys.  Cardiac Studies:   Lexiscan myoview stress test 05/20/2015:  1. The resting electrocardiogram demonstrated normal sinus rhythm, incomplete RBBB and no resting arrhythmias.  Stress EKG is non-diagnostic for ischemia as it a pharmacologic stress using Lexiscan.  2. Myocardial perfusion imaging is normal. Overall  left ventricular systolic function was normal without regional wall motion abnormalities. The left ventricular ejection fraction was  64%.  Renal artery duplex 04/24/2016: No evidence of renal artery occlusive disease in either renal artery. Study suggests patency of bilateral renal stents. Compared to 11/08/2015, right renal artery restenosis no longer present. Normal intrarenal vascular perfusion is noted in both kidneys. Diffuse plaque noted in the abdominal aorta. Clinical correlation is suggested.  Echocardiogram 12/26/2020:    Normal LV systolic function with EF 57%. Left ventricle cavity is normal in size. Moderate concentric hypertrophy of the left ventricle. Normal global wall motion. Normal diastolic filling pattern. Calculated EF 57%.  Structurally normal tricuspid valve.  Mild tricuspid regurgitation. No evidence of pulmonary hypertension.  No significant change from 05/31/2015.  Carotid artery duplex 12/13/2021:  Duplex suggests stenosis in the right distal internal carotid artery  (total occlusion). Right proximal ICA stenosis of >70%.  Stenosis in the left internal carotid artery (>=70%). Duplex suggests  stenosis in the left external carotid artery (>50%).  The PSV internal/common carotid artery ratio of 8.68 on right and  6.05 on  the left is consistent with a stenosis of >70% bilaterally.  Antegrade right vertebral artery flow. Antegrade left vertebral artery flow.  Compared to the study done on 06/02/2021, occlusion of the right distal ICA is new.  Clinical correlation is recommended.  Consider CT angiogram if clinically indicated. Follow up in four months is appropriate if clinically indicated.  Carotid artery duplex 12/24/22  Duplex suggests stenosis in the right internal carotid artery (50-69%).  >50% stenosis in the right external carotid artery.  Duplex suggests stenosis in the left internal carotid artery (50-69%).  >50% stenosis in the left external carotid artery.  Antegrade right vertebral artery flow. Antegrade left vertebral artery  flow.  Compared to the study done on 04/24/2022, right ICA  stenosis of 80 to 99%  with a peak velocity of 473/119 cm/s and ICA/CCA ratio of 4.16 now appears  to have improved, stenosis severity now on the right and the upper limit  of 50 to 69%.  Similarly on the left stenosis severity has reduced from >70% to the  present 50 to 69%, upper range of the spectrum.  Diffuse homogenous plaque in bilateral CCA and ICA again noted. Follow up  in six months is appropriate if clinically indicated.    EKG  06/29/2022: Sinus rhythm at a rate of 63 bpm. Non-specific T wave changes. Repeating echocardiogram  12/27/2021: Sinus rhythm at a rate of 64 bpm.  Right axis.  Left atrial enlargement.  Incomplete right bundle branch block.  No evidence of ischemia or underlying injury pattern.  Consider pulmonary disease pattern.  Compared EKG 06/19/2021, no significant change.  12/19/2020: Normal sinus rhythm at rate of 68 bpm, left atrial enlargement, right axis deviation, left posterior fascicular block.  Incomplete right bundle branch block.  No evidence of ischemia.  -possible pulmonary disease.  Compared to 06/20/2020, rightward axis is new.   Assessment     ICD-10-CM   1. Essential hypertension  I10     2. Hypercholesteremia  E78.00     3. Asymptomatic carotid artery stenosis, bilateral  I65.23 PCV CAROTID DUPLEX (BILATERAL)       No orders of the defined types were placed in this encounter.    There are no discontinued medications.   No orders of the defined types were placed in this encounter.     Recommendations:   Julie [name]  is a 67  y.o. AAF patient with severe anxiety, long-standing history of difficult to control hypertension, orthostatic hypotension due to diabetic autonomic insufficiency, bilateral renal artery stenting for high-grade bilateral renal artery stenosis in September 2016, stage III-IV chronic kidney disease secondary to uncontrolled diabetes mellitus, high-grade asymptomatic left carotid artery stenosis and moderate stenosis of  the right carotid artery, has been reluctant to having any surgical procedures for the same, had seen Dr. Trula Slade in the past, obesity, and hyperlipidemia.   Essential hypertension Continue current cardiac medications. Encourage low-sodium diet, less than 2000 mg daily. Follow-up in 6 months, sooner if needed. She still needs to schedule repeat echo  RPM DATA Patient's home blood pressure is well controlled on current antihypertensive regimen.    Systolic Blood Pressure      mmHg  --          120.4 (XX123456 - Q000111Q) Diastolic Blood Pressure     mmHg  --          61.5 (52.0 - 71.0) Heart Rate      bpm     --          63.2 (54.0 - 74.0)     12/31/22 7:39 AM                       126      /           63        mmHg  74        bpm      12/27/22 8:15 AM                     114      /           61        mmHg  64        bpm      12/26/22 7:22 AM                     128      /           69        mmHg  71        bpm      12/24/22 7:09 AM                     129      /           71        mmHg  69        bpm      12/23/22 9:13 AM                     120      /           57        mmHg  66        bpm      12/22/22 8:45 AM                     111      /           56        mmHg  66        bpm      12/20/22 8:10 AM  111      /           56        mmHg  63        bpm      12/17/22 6:07 AM                     126      /           64        mmHg  68        bpm      12/13/22 6:49 AM                     125      /           69        mmHg  65        Hypercholesteremia Continue Crestor 10 mg daily   Asymptomatic carotid artery stenosis, bilateral Carotid stenosis has actually improved per u/s done in February 2024, results above Next carotid u/s to be done around August 2024, before our next appt     Floydene Flock, DO, [facility] 12/31/2022, 9:38 AM Office: [phone]

## 2022-12-31 NOTE — Telephone Encounter (Signed)
Patient's home blood pressure is well controlled on current antihypertensive regimen.   Systolic Blood Pressure mmHg -- 120.4 ([id number] - Q000111Q) Diastolic Blood Pressure mmHg -- 61.5 (52.0 - 71.0) Heart Rate bpm -- 63.2 (54.0 - 74.0)   12/31/22 7:39 AM  126 / 63 mmHg 74 bpm  12/27/22 8:15 AM  114 / 61 mmHg 64 bpm  12/26/22 7:22 AM  128 / 69 mmHg 71 bpm  12/24/22 7:09 AM  129 / 71 mmHg 69 bpm  12/23/22 9:13 AM  120 / 57 mmHg 66 bpm  12/22/22 8:45 AM  111 / 56 mmHg 66 bpm  12/20/22 8:10 AM  111 / 56 mmHg 63 bpm  12/17/22 6:07 AM  126 / 64 mmHg 68 bpm  12/13/22 6:49 AM  125 / 69 mmHg 65 bpm

## 2023-01-03 DIAGNOSIS — I10 Essential (primary) hypertension: Secondary | ICD-10-CM | POA: Diagnosis not present

## 2023-01-21 DIAGNOSIS — Z0000 Encounter for general adult medical examination without abnormal findings: Secondary | ICD-10-CM | POA: Diagnosis not present

## 2023-01-21 DIAGNOSIS — Z1331 Encounter for screening for depression: Secondary | ICD-10-CM | POA: Diagnosis not present

## 2023-01-21 DIAGNOSIS — I779 Disorder of arteries and arterioles, unspecified: Secondary | ICD-10-CM | POA: Diagnosis not present

## 2023-01-21 DIAGNOSIS — Z1159 Encounter for screening for other viral diseases: Secondary | ICD-10-CM | POA: Diagnosis not present

## 2023-01-21 DIAGNOSIS — I10 Essential (primary) hypertension: Secondary | ICD-10-CM | POA: Diagnosis not present

## 2023-01-21 DIAGNOSIS — E1122 Type 2 diabetes mellitus with diabetic chronic kidney disease: Secondary | ICD-10-CM | POA: Diagnosis not present

## 2023-01-21 DIAGNOSIS — E782 Mixed hyperlipidemia: Secondary | ICD-10-CM | POA: Diagnosis not present

## 2023-01-21 DIAGNOSIS — N184 Chronic kidney disease, stage 4 (severe): Secondary | ICD-10-CM | POA: Diagnosis not present

## 2023-01-21 DIAGNOSIS — E039 Hypothyroidism, unspecified: Secondary | ICD-10-CM | POA: Diagnosis not present

## 2023-02-02 DIAGNOSIS — I10 Essential (primary) hypertension: Secondary | ICD-10-CM | POA: Diagnosis not present

## 2023-02-19 DIAGNOSIS — Z13820 Encounter for screening for osteoporosis: Secondary | ICD-10-CM | POA: Diagnosis not present

## 2023-02-19 DIAGNOSIS — Z130 Encounter for screening for diseases of the blood and blood-forming organs and certain disorders involving the immune mechanism: Secondary | ICD-10-CM | POA: Diagnosis not present

## 2023-02-19 DIAGNOSIS — Z01419 Encounter for gynecological examination (general) (routine) without abnormal findings: Secondary | ICD-10-CM | POA: Diagnosis not present

## 2023-02-19 DIAGNOSIS — N951 Menopausal and female climacteric states: Secondary | ICD-10-CM | POA: Diagnosis not present

## 2023-02-19 DIAGNOSIS — Z1389 Encounter for screening for other disorder: Secondary | ICD-10-CM | POA: Diagnosis not present

## 2023-02-19 DIAGNOSIS — Z1231 Encounter for screening mammogram for malignant neoplasm of breast: Secondary | ICD-10-CM | POA: Diagnosis not present

## 2023-02-20 ENCOUNTER — Other Ambulatory Visit: Payer: Self-pay | Admitting: Obstetrics and Gynecology

## 2023-02-20 DIAGNOSIS — Z13820 Encounter for screening for osteoporosis: Secondary | ICD-10-CM

## 2023-03-04 DIAGNOSIS — I10 Essential (primary) hypertension: Secondary | ICD-10-CM | POA: Diagnosis not present

## 2023-03-07 ENCOUNTER — Other Ambulatory Visit: Payer: Self-pay | Admitting: Cardiology

## 2023-03-27 ENCOUNTER — Encounter: Payer: Self-pay | Admitting: Gastroenterology

## 2023-04-03 DIAGNOSIS — I10 Essential (primary) hypertension: Secondary | ICD-10-CM | POA: Diagnosis not present

## 2023-04-11 DIAGNOSIS — H2513 Age-related nuclear cataract, bilateral: Secondary | ICD-10-CM | POA: Diagnosis not present

## 2023-04-11 DIAGNOSIS — H401132 Primary open-angle glaucoma, bilateral, moderate stage: Secondary | ICD-10-CM | POA: Diagnosis not present

## 2023-04-11 DIAGNOSIS — H43813 Vitreous degeneration, bilateral: Secondary | ICD-10-CM | POA: Diagnosis not present

## 2023-04-11 DIAGNOSIS — E113293 Type 2 diabetes mellitus with mild nonproliferative diabetic retinopathy without macular edema, bilateral: Secondary | ICD-10-CM | POA: Diagnosis not present

## 2023-05-07 DIAGNOSIS — I129 Hypertensive chronic kidney disease with stage 1 through stage 4 chronic kidney disease, or unspecified chronic kidney disease: Secondary | ICD-10-CM | POA: Diagnosis not present

## 2023-05-07 DIAGNOSIS — E785 Hyperlipidemia, unspecified: Secondary | ICD-10-CM | POA: Diagnosis not present

## 2023-05-07 DIAGNOSIS — N1832 Chronic kidney disease, stage 3b: Secondary | ICD-10-CM | POA: Diagnosis not present

## 2023-05-07 DIAGNOSIS — E1122 Type 2 diabetes mellitus with diabetic chronic kidney disease: Secondary | ICD-10-CM | POA: Diagnosis not present

## 2023-05-07 DIAGNOSIS — E876 Hypokalemia: Secondary | ICD-10-CM | POA: Diagnosis not present

## 2023-05-20 DIAGNOSIS — K644 Residual hemorrhoidal skin tags: Secondary | ICD-10-CM | POA: Diagnosis not present

## 2023-06-17 DIAGNOSIS — E7800 Pure hypercholesterolemia, unspecified: Secondary | ICD-10-CM | POA: Diagnosis not present

## 2023-06-17 DIAGNOSIS — E559 Vitamin D deficiency, unspecified: Secondary | ICD-10-CM | POA: Diagnosis not present

## 2023-06-17 DIAGNOSIS — E039 Hypothyroidism, unspecified: Secondary | ICD-10-CM | POA: Diagnosis not present

## 2023-06-17 DIAGNOSIS — E1165 Type 2 diabetes mellitus with hyperglycemia: Secondary | ICD-10-CM | POA: Diagnosis not present

## 2023-06-24 DIAGNOSIS — F419 Anxiety disorder, unspecified: Secondary | ICD-10-CM | POA: Diagnosis not present

## 2023-06-24 DIAGNOSIS — E1165 Type 2 diabetes mellitus with hyperglycemia: Secondary | ICD-10-CM | POA: Diagnosis not present

## 2023-06-24 DIAGNOSIS — E7800 Pure hypercholesterolemia, unspecified: Secondary | ICD-10-CM | POA: Diagnosis not present

## 2023-06-24 DIAGNOSIS — E039 Hypothyroidism, unspecified: Secondary | ICD-10-CM | POA: Diagnosis not present

## 2023-06-24 DIAGNOSIS — R748 Abnormal levels of other serum enzymes: Secondary | ICD-10-CM | POA: Diagnosis not present

## 2023-06-24 DIAGNOSIS — E559 Vitamin D deficiency, unspecified: Secondary | ICD-10-CM | POA: Diagnosis not present

## 2023-06-24 DIAGNOSIS — I10 Essential (primary) hypertension: Secondary | ICD-10-CM | POA: Diagnosis not present

## 2023-06-24 DIAGNOSIS — N189 Chronic kidney disease, unspecified: Secondary | ICD-10-CM | POA: Diagnosis not present

## 2023-06-24 NOTE — Progress Notes (Unsigned)
06/26/2023 Julie [name] [id number] Oct 01, 1956  Referring provider: Merri Brunette, MD Primary GI doctor: Dr. Myrtie Neither  ASSESSMENT AND PLAN:   History of adenomatous polyp of colon Patient due for colonoscopy, appropriate for [facility], will plan colon With Dr. Myrtie Neither We have discussed the risks of bleeding, infection, perforation, medication reactions, and remote risk of death associated with colonoscopy. All questions were answered and the patient acknowledges these risk and wishes to proceed.  Stenosis of carotid artery, unspecified laterality Hold Plavix for 5 days before procedure will instruct when and how to resume after procedure.  Patient understands that there is a low but real risk of cardiovascular event such as heart attack, stroke, or embolism /  thrombosis, or ischemia while off Plavix.  The patient consents to proceed.  Will communicate by phone or EMR with patient's prescribing provider to confirm that holding Plavix is reasonable in this case.   Uncontrolled type 2 diabetes mellitus with hyperglycemia (HCC) Discussed GLP1 with the patient, mechanism of action and how this can worsen and/or cause nausea by causing gastroparesis.   Discuss with primary care see about potentially getting off this medication. Gastroparesis diet given to the patient.  Patient should be instructed to hold this medications if dose falls within 2 days of endoscopic procedure, due to increased risk of retained gastric contents.  GERD Worse with monjaro, no alarm symptoms, controlled diet Lifestyle changes discussed, avoid NSAIDS, ETOH Weight loss discussed with the patient   Patient Care Team: Merri Brunette, MD as PCP - General (Family Medicine) Linna Darner, RD as Dietitian (Family Medicine) Yates Decamp, MD as Consulting Physician (Cardiology) Donnal Debar., MD as Attending Physician (Internal Medicine) Dorisann Frames, MD as Consulting Physician (Endocrinology)  HISTORY OF  PRESENT ILLNESS: 67 y.o. female referred by Merri Brunette, MD presents for consultation of colonoscopy while on blood thinner.  She has past medical history significant for hypertension secondary to renal artery stenosis, carotid stenosis high-grade asymptomatic left and moderate stenosis right reluctant to have any surgical procedures, type 2 diabetes stage III CKD and others listed below, Patient is on Plavix for peripheral arterial disease, prescribed by Dr. Jacinto Halim.   05/30/2017 colonoscopy Dr. Myrtie Neither 2 mm polyp proximal transverse colon, distal transverse colon 3 mm polyp rectum recall 5 years.  Patient denies  family history of colon cancer or other gastrointestinal malignancies. Patient has a BM every 3-4 days, has taken one dose of . She began having constipation when starting mounjaro 10 months ago.  She states about a month ago, she had hemorrhoids, she saw BRB on TP. Had one episode of BRB rectal bleeding after passing gas, went to PCP and told it was her hemorrhoid. No rectal pain, no diarrhea, AB pain.  Patient does complain of GERD but controlled with mounjaro.  Patient denies dysphagia, nausea, vomiting, melena.   She denies NSAID use.  She denies ETOH use.   She denies tobacco use.  She denies drug use.    She  reports that she has never smoked. She has never used smokeless tobacco. She reports that she does not drink alcohol and does not use drugs.  RELEVANT LABS AND IMAGING: CBC    Component Value Date/Time   WBC 6.1 12/16/2015 0450   RBC 4.96 12/16/2015 0450   HGB 11.9 (L) 12/16/2015 0450   HCT 37.1 12/16/2015 0450   PLT 138 (L) 12/16/2015 0450   MCV 74.8 (L) 12/16/2015 0450   MCH 24.0 (L) 12/16/2015 8657  MCHC 32.1 12/16/2015 0450   RDW 15.8 (H) 12/16/2015 0450   No results for input(s): "HGB" in the last 8760 hours.  CMP     Component Value Date/Time   NA 140 12/16/2015 0450   K 2.8 (L) 12/16/2015 0450   CL 101 12/16/2015 0450   CO2 29 12/16/2015 0450    GLUCOSE 247 (H) 12/16/2015 0450   BUN 21 (H) 12/16/2015 0450   CREATININE 2.18 (H) 12/16/2015 0450   CREATININE 2.12 (H) 10/13/2015 1139   CALCIUM 9.4 12/16/2015 0450   GFRNONAA 24 (L) 12/16/2015 0450   GFRAA 27 (L) 12/16/2015 0450       No data to display            Current Medications:   Current Outpatient Medications (Endocrine & Metabolic):    levothyroxine (SYNTHROID) 125 MCG tablet, Take 125 mcg by mouth daily.   tirzepatide [facility]) 2.5 MG/0.5ML Pen, 2.5 mg once a week.   insulin aspart (NOVOLOG) 100 UNIT/ML injection, See admin instructions. (Patient not taking: Reported on 06/26/2023)  Current Outpatient Medications (Cardiovascular):    amLODipine (NORVASC) 10 MG tablet, TAKE 1 TABLET BY MOUTH DAILY   carvedilol (COREG) 25 MG tablet, TAKE 1 TABLET(25 MG) BY MOUTH TWICE DAILY WITH A MEAL   chlorthalidone (HYGROTON) 25 MG tablet, TAKE 1 TABLET BY MOUTH EVERY MORNING   cloNIDine (CATAPRES) 0.1 MG tablet, Take 1 tablet (0.1 mg total) by mouth 2 (two) times daily.   Evolocumab (REPATHA SURECLICK) 140 MG/ML SOAJ, Inject 1 mL into the skin every 14 (fourteen) days.   rosuvastatin (CRESTOR) 10 MG tablet, 1 tablet Orally Once a day for 30 day(s)   hydrALAZINE (APRESOLINE) 50 MG tablet, Take 2 tablets (100 mg total) by mouth 3 (three) times daily.   Current Outpatient Medications (Analgesics):    aspirin EC 81 MG tablet, Take 81 mg by mouth daily. Swallow whole.  Current Outpatient Medications (Hematological):    clopidogrel (PLAVIX) 75 MG tablet, TAKE 1 TABLET BY MOUTH DAILY  Current Outpatient Medications (Other):    buPROPion (WELLBUTRIN SR) 150 MG 12 hr tablet, Take 1 tablet (150 mg total) by mouth 2 (two) times daily.   ergocalciferol (VITAMIN D2) 50000 UNITS capsule, Take 50,000 Units by mouth. 1 every 2 weeks   Potassium Chloride ER 20 MEQ TBCR, Take 1 tablet by mouth 2 (two) times daily.   ZIOPTAN 0.0015 % SOLN, SMARTSIG:1 Drop(s) In Eye(s) Every Evening (Patient  not taking: Reported on 06/26/2023)  Medical History:  Past Medical History:  Diagnosis Date   Abnormal thyroid scan    decreased thyroid   Actinomyces infection    h/o   Anovulation    chronic   Encounter for IUD insertion 09/15/2010 and 09/17/2005   Mirena   Encounter for IUD insertion 06/02/1990   Paraguard   H/O amenorrhea    Hx of elevated lipids    Hypertension    Hypothyroidism    Obesity    Oligomenorrhea    h/o   PCOS (polycystic ovarian syndrome)    Type 2 diabetes mellitus (HCC)    Allergies:  Allergies  Allergen Reactions   Sulfa Antibiotics Rash and Other (See Comments)    Wheezing   Beta Adrenergic Blockers Other (See Comments)   Glipizide     Other reaction(s): low BS/jittery   Maxzide [Triamterene-Hctz]     Other reaction(s): Unknown   Metformin Hcl Other (See Comments)   Metoprolol Tartrate Other (See Comments)   Zocor [Simvastatin]  Other reaction(s): itching     Surgical History:  She  has a past surgical history that includes Wisdom tooth extraction (1976); Thyroid exploration; Cardiac catheterization (N/A, 07/05/2015); Cardiac catheterization (Bilateral, 07/05/2015); Cardiac catheterization (N/A, 12/16/2015); Cardiac catheterization (Left, 12/16/2015); and Cardiac catheterization (12/16/2015). Family History:  Her family history includes Diabetes in her father and mother; Hypertension in her maternal grandmother, mother, and paternal grandmother.  REVIEW OF SYSTEMS  : All other systems reviewed and negative except where noted in the History of Present Illness.  PHYSICAL EXAM: BP (!) 140/78 (BP Location: Left Arm, Patient Position: Sitting, Cuff Size: Large)   Pulse 82   Ht 5\' 3"  (1.6 m)   Wt 244 lb 4 oz (110.8 kg)   SpO2 98%   BMI 43.27 kg/m  General Appearance: Well nourished, in no apparent distress. Head:   Normocephalic and atraumatic. Eyes:  sclerae anicteric,conjunctive pink  Respiratory: Respiratory effort normal, BS equal bilaterally  without rales, rhonchi, wheezing. Cardio: RRR with midsystolic murmur peripheral pulses intact.  Abdomen: Soft,  Obese ,active bowel sounds. No tenderness. No masses. Rectal: Not evaluated Musculoskeletal: Full ROM, Normal gait. Without edema. Skin:  Dry and intact without significant lesions or rashes Neuro: Alert and  oriented x4;  No focal deficits. Psych:  Cooperative. Normal mood and affect.    Doree Albee, PA-C 9:02 AM

## 2023-06-26 ENCOUNTER — Encounter: Payer: Self-pay | Admitting: Physician Assistant

## 2023-06-26 ENCOUNTER — Ambulatory Visit: Payer: Medicare PPO | Admitting: Physician Assistant

## 2023-06-26 VITALS — BP 140/78 | HR 82 | Ht 63.0 in | Wt 244.2 lb

## 2023-06-26 DIAGNOSIS — Z86010 Personal history of colonic polyps: Secondary | ICD-10-CM

## 2023-06-26 DIAGNOSIS — K219 Gastro-esophageal reflux disease without esophagitis: Secondary | ICD-10-CM

## 2023-06-26 DIAGNOSIS — E1165 Type 2 diabetes mellitus with hyperglycemia: Secondary | ICD-10-CM

## 2023-06-26 DIAGNOSIS — Z860101 Personal history of adenomatous and serrated colon polyps: Secondary | ICD-10-CM

## 2023-06-26 DIAGNOSIS — I6529 Occlusion and stenosis of unspecified carotid artery: Secondary | ICD-10-CM

## 2023-06-26 MED ORDER — NA SULFATE-K SULFATE-MG SULF 17.5-3.13-1.6 GM/177ML PO SOLN: 1.0000 | Freq: Once | ORAL | 0 refills | Status: AC

## 2023-06-26 NOTE — Patient Instructions (Signed)
Miralax is an osmotic laxative.  It only brings more water into the stool.  This is safe to take daily.  Can take up to 17 gram of miralax twice a day.  Mix with juice or coffee.  Start 1 capful at night for 3-4 days and reassess your response in 3-4 days.  You can increase and decrease the dose based on your response.  Remember, it can take up to 3-4 days to take effect OR for the effects to wear off.   I often pair this with benefiber in the morning to help assure the stool is not too loose.   Avoid spicy and acidic foods Avoid fatty foods Limit your intake of coffee, tea, alcohol, and carbonated drinks Work to maintain a healthy weight Keep the head of the bed elevated at least 3 inches with blocks or a wedge pillow if you are having any nighttime symptoms Stay upright for 2 hours after eating Avoid meals and snacks three to four hours before bedtime  Gastroparesis Please do small frequent meals like 4-6 meals a day.  Eat and drink liquids at separate times.  Avoid high fiber foods, cook your vegetables, avoid high fat food.  Suggest spreading protein throughout the day (greek yogurt, glucerna, soft meat, milk, eggs) Choose soft foods that you can mash with a fork When you are more symptomatic, change to pureed foods foods and liquids.  Consider reading "Living well with Gastroparesis" by Reuel Derby Gastroparesis is a condition in which food takes longer than normal to empty from the stomach. This condition is also known as delayed gastric emptying. It is usually a long-term (chronic) condition. There is no cure, but there are treatments and things that you can do at home to help relieve symptoms. Treating the underlying condition that causes gastroparesis can also help relieve symptoms What are the causes? In many cases, the cause of this condition is not known. Possible causes include: A hormone (endocrine) disorder, such as hypothyroidism or diabetes. A nervous system  disease, such as Parkinson's disease or multiple sclerosis. Cancer, infection, or surgery that affects the stomach or vagus nerve. The vagus nerve runs from your chest, through your neck, and to the lower part of your brain. A connective tissue disorder, such as scleroderma. Certain medicines. What increases the risk? You are more likely to develop this condition if: You have certain disorders or diseases. These may include: An endocrine disorder. An eating disorder. Amyloidosis. Scleroderma. Parkinson's disease. Multiple sclerosis. Cancer or infection of the stomach or the vagus nerve. You have had surgery on your stomach or vagus nerve. You take certain medicines. You are female. What are the signs or symptoms? Symptoms of this condition include: Feeling full after eating very little or a loss of appetite. Nausea, vomiting, or heartburn. Bloating of your abdomen. Inconsistent blood sugar (glucose) levels on blood tests. Unexplained weight loss. Acid from the stomach coming up into the esophagus (gastroesophageal reflux). Sudden tightening (spasm) of the stomach, which can be painful. Symptoms may come and go. Some people may not notice any symptoms. How is this diagnosed? This condition is diagnosed with tests, such as: Tests that check how long it takes food to move through the stomach and intestines. These tests include: Upper gastrointestinal (GI) series. For this test, you drink a liquid that shows up well on X-rays, and then X-rays are taken of your intestines. Gastric emptying scintigraphy. For this test, you eat food that contains a small amount of radioactive material, and  then scans are taken. Wireless capsule GI monitoring system. For this test, you swallow a pill (capsule) that records information about how foods and fluid move through your stomach. Gastric manometry. For this test, a tube is passed down your throat and into your stomach to measure electrical and  muscular activity. Endoscopy. For this test, a long, thin tube with a camera and light on the end is passed down your throat and into your stomach to check for problems in your stomach lining. Ultrasound. This test uses sound waves to create images of the inside of your body. This can help rule out gallbladder disease or pancreatitis as a cause of your symptoms. How is this treated? There is no cure for this condition, but treatment and home care may relieve symptoms. Treatment may include: Treating the underlying cause. Managing your symptoms by making changes to your diet and exercise habits. Taking medicines to control nausea and vomiting and to stimulate stomach muscles. Getting food through a feeding tube in the hospital. This may be done in severe cases. Having surgery to insert a device called a gastric electrical stimulator into your body. This device helps improve stomach emptying and control nausea and vomiting. Follow these instructions at home: Take over-the-counter and prescription medicines only as told by your health care provider. Follow instructions from your health care provider about eating or drinking restrictions. Your health care provider may recommend that you: Eat smaller meals more often. Eat low-fat foods. Eat low-fiber forms of high-fiber foods. For example, eat cooked vegetables instead of raw vegetables. Have only liquid foods instead of solid foods. Liquid foods are easier to digest. Drink enough fluid to keep your urine pale yellow. Exercise as often as told by your health care provider. Keep all follow-up visits. This is important. Contact a health care provider if you: Notice that your symptoms do not improve with treatment. Have new symptoms. Get help right away if you: Have severe pain in your abdomen that does not improve with treatment. Have nausea that is severe or does not go away. Vomit every time you drink fluids. Summary Gastroparesis is a  long-term (chronic) condition in which food takes longer than normal to empty from the stomach. Symptoms include nausea, vomiting, heartburn, bloating of your abdomen, and loss of appetite. Eating smaller portions, low-fat foods, and low-fiber forms of high-fiber foods may help you manage your symptoms. Get help right away if you have severe pain in your abdomen. This information is not intended to replace advice given to you by your health care provider. Make sure you discuss any questions you have with your health care provider. Document Revised: 02/22/2020 Document Reviewed: 02/22/2020 Elsevier Patient Education  2021 [employer]    You have been scheduled for a colonoscopy. Please follow written instructions given to you at your visit today.   Please pick up your prep supplies at the pharmacy within the next 1-3 days.  If you use inhalers (even only as needed), please bring them with you on the day of your procedure.  DO NOT TAKE 7 DAYS PRIOR TO TEST- Trulicity (dulaglutide) Ozempic, Wegovy (semaglutide) Mounjaro (tirzepatide) Bydureon Bcise (exanatide extended release)  DO NOT TAKE 1 DAY PRIOR TO YOUR TEST Rybelsus (semaglutide) Adlyxin (lixisenatide) Victoza (liraglutide) Byetta (exanatide) ___________________________________________________________________________ _______________________________________________________  If your blood pressure at your visit was 140/90 or greater, please contact your primary care physician to follow up on this.  _______________________________________________________  If you are age 14 or older, your body mass index should  be between 23-30. Your Body mass index is 43.27 kg/m. If this is out of the aforementioned range listed, please consider follow up with your Primary Care Provider.  If you are age 67 or younger, your body mass index should be between 19-25. Your Body mass index is 43.27 kg/m. If this is out of the aformentioned range  listed, please consider follow up with your Primary Care Provider.   ________________________________________________________  The [facility] providers would like to encourage you to use [facility] to communicate with providers for non-urgent requests or questions.  Due to long hold times on the telephone, sending your provider a message by [facility] may be a faster and more efficient way to get a response.  Please allow 48 business hours for a response.  Please remember that this is for non-urgent requests.  _______________________________________________________ It was a pleasure to see you today!  Thank you for trusting me with your gastrointestinal care!

## 2023-06-26 NOTE — Progress Notes (Signed)
____________________________________________________________  Attending physician addendum:  Thank you for sending this case to me. I have reviewed the entire note and agree with the plan.   Henry Danis, MD  ____________________________________________________________  

## 2023-07-03 ENCOUNTER — Encounter: Payer: Self-pay | Admitting: Cardiology

## 2023-07-03 ENCOUNTER — Ambulatory Visit: Payer: Medicare PPO | Admitting: Cardiology

## 2023-07-03 VITALS — BP 147/78 | HR 77 | Resp 14 | Ht 63.0 in | Wt 246.6 lb

## 2023-07-03 DIAGNOSIS — I6523 Occlusion and stenosis of bilateral carotid arteries: Secondary | ICD-10-CM | POA: Diagnosis not present

## 2023-07-03 DIAGNOSIS — E7800 Pure hypercholesterolemia, unspecified: Secondary | ICD-10-CM

## 2023-07-03 DIAGNOSIS — I10 Essential (primary) hypertension: Secondary | ICD-10-CM | POA: Diagnosis not present

## 2023-07-03 NOTE — Progress Notes (Signed)
Primary Physician/Referring:  Merri Brunette, MD  Julie [name] ID: Julie [name], female    DOB: December 13, 1955, 67 y.o.   MRN: [id number]  Chief Complaint  Julie [name] presents with   Essential hypertension   Asymptomatic carotid artery stenosis, bilateral   Follow-up    6 months   HPI:    Julie [name]  [name] a 67 y.o. AAF Julie [name] with severe anxiety, long-standing history of difficult to control hypertension, orthostatic hypotension due to diabetic autonomic insufficiency, bilateral renal artery stenting for high-grade bilateral renal artery stenosis in September 2016, stage III-IV chronic kidney disease secondary to uncontrolled diabetes mellitus, high-grade asymptomatic left carotid artery stenosis and moderate stenosis of the right carotid artery.  Julie [name] presently doing well and has no specific complaints.  Julie [name] denies chest pain, dyspnea, symptoms of claudication or TIA.  Past Medical History:  Diagnosis Date   Abnormal thyroid scan    decreased thyroid   Actinomyces infection    h/o   Anovulation    chronic   Encounter for IUD insertion 09/15/2010 and 09/17/2005   Mirena   Encounter for IUD insertion 06/02/1990   Paraguard   H/O amenorrhea    Hx of elevated lipids    Hypertension    Hypothyroidism    Obesity    Oligomenorrhea    h/o   PCOS (polycystic ovarian syndrome)    Type 2 diabetes mellitus (HCC)    Past Surgical History:  Procedure Laterality Date   PERIPHERAL VASCULAR CATHETERIZATION N/A 07/05/2015   Procedure: Renal Angiography;  Surgeon: Yates Decamp, MD;  Location: [facility] INVASIVE CV LAB;  Service: Cardiovascular;  Laterality: N/A;   PERIPHERAL VASCULAR CATHETERIZATION Bilateral 07/05/2015   Procedure: Renal Intervention;  Surgeon: Yates Decamp, MD;  Location: [facility] INVASIVE CV LAB;  Service: Cardiovascular;  Laterality: Bilateral;  right and left renal stent placement   PERIPHERAL VASCULAR CATHETERIZATION N/A 12/16/2015   Procedure: Renal Angiography;  Surgeon: Yates Decamp, MD;   Location: [facility] INVASIVE CV LAB;  Service: Cardiovascular;  Laterality: N/A;   PERIPHERAL VASCULAR CATHETERIZATION Left 12/16/2015   Procedure: Carotid Angiography/Bilateral;  Surgeon: Yates Decamp, MD;  Location: [facility] INVASIVE CV LAB;  Service: Cardiovascular;  Laterality: Left;   PERIPHERAL VASCULAR CATHETERIZATION  12/16/2015   Procedure: Peripheral Vascular Balloon Angioplasty;  Surgeon: Yates Decamp, MD;  Location: [facility] INVASIVE CV LAB;  Service: Cardiovascular;;  both sides   THYROID EXPLORATION     PT STATES HAD PROCEDURE DONE ON THYROID   WISDOM TOOTH EXTRACTION  1976   Family History  Problem Relation Age of Onset   Hypertension Mother    Diabetes Mother    Diabetes Father    Hypertension Maternal Grandmother    Hypertension Paternal Grandmother     Social History   Tobacco Use   Smoking status: Never   Smokeless tobacco: Never  Substance Use Topics   Alcohol use: No   Marital Status: Divorced  ROS  Review of Systems  Cardiovascular:  Negative for chest pain, dyspnea on exertion and leg swelling.   Objective      07/03/2023    9:20 AM 06/26/2023    8:38 AM 12/31/2022    8:49 AM  Vitals with BMI  Height 5\' 3"  5\' 3"  5\' 3"   Weight 246 lbs 10 oz 244 lbs 4 oz 232 lbs 10 oz  BMI 43.69 43.28 41.21  Systolic 147 140 166  Diastolic 78 78 61  Pulse 77 82 79   Blood pressure (!) 147/78, pulse 77, resp. rate  14, height 5\' 3"  (1.6 m), weight 246 lb 9.6 oz (111.9 kg), SpO2 97%.  Physical Exam Constitutional:      Appearance: Julie [name] morbidly obese.  Neck:     Vascular: Carotid bruit (bilateral) present. No JVD.  Cardiovascular:     Rate and Rhythm: Normal rate and regular rhythm.     Pulses: Intact distal pulses.     Heart sounds: Murmur heard.     Early systolic murmur [name] present with a grade of 2/6 at the upper right sternal border.     No gallop.  Pulmonary:     Effort: Pulmonary effort [name] normal.     Breath sounds: Normal breath sounds.  Abdominal:     General: Bowel sounds  are normal.     Palpations: Abdomen [name] soft.  Musculoskeletal:     Right lower leg: Edema (2+ ankle) present.     Left lower leg: Edema (2 plus ankle) present.    Laboratory examination:   External labs:   Labs 01/22/2023:  Hb 13.0/HCT 40.6, platelets 141, normal indicis.  Serum glucose 100 mg, BUN 26, creatinine 1.50, EGFR 38 mL, potassium 3.8, sodium 139.  LFTs normal.  Cholesterol, total 122.000 m 06/17/2023 HDL 60.000 mg 06/17/2023 LDL 45.000 mg 06/17/2023 Triglycerides 87.000 mg 06/17/2023  A1C 6.000 % 06/17/2023 TSH 2.350 06/17/2023  Radiology:    Cardiac Studies:   Lexiscan myoview stress test 05/20/2015:  1. The resting electrocardiogram demonstrated normal sinus rhythm, incomplete RBBB and no resting arrhythmias.  Stress EKG [name] non-diagnostic for ischemia as it a pharmacologic stress using Lexiscan.  2. Myocardial perfusion imaging [name] normal. Overall left ventricular systolic function was normal without regional wall motion abnormalities. The left ventricular ejection fraction was 64%.  Renal artery duplex 04/24/2016: No evidence of renal artery occlusive disease in either renal artery. Study suggests patency of bilateral renal stents. Compared to 11/08/2015, right renal artery restenosis no longer present. Normal intrarenal vascular perfusion [name] noted in both kidneys. Diffuse plaque noted in the abdominal aorta. Clinical correlation [name] suggested.  Echocardiogram 12/26/2020:    Normal LV systolic function with EF 57%. Left ventricle cavity [name] normal in size. Moderate concentric hypertrophy of the left ventricle. Normal global wall motion. Normal diastolic filling pattern. Calculated EF 57%.  Structurally normal tricuspid valve.  Mild tricuspid regurgitation. No evidence of pulmonary hypertension.  No significant change from 05/31/2015.  Carotid artery duplex 12/24/22  Duplex suggests stenosis in the right internal carotid artery (50-69%). >50% stenosis in the right  external carotid artery. Duplex suggests stenosis in the left internal carotid artery (50-69%). >50% stenosis in the left external carotid artery. Antegrade right vertebral artery flow. Antegrade left vertebral artery flow. Compared to the study done on 04/24/2022, right ICA stenosis of 80 to 99% with a peak velocity of 473/119 cm/s and ICA/CCA ratio of 4.16 now appears to have improved, stenosis severity now on the right and the upper limit of 50 to 69%. Similarly on the left stenosis severity has reduced from >70% to the present 50 to 69%, upper range of the spectrum. Diffuse homogenous plaque in bilateral CCA and ICA again noted. Follow up in six months [name] appropriate if clinically indicated.  EKG:   EKG 07/03/2023: Normal sinus rhythm at the rate of 69 bpm, left atrial enlargement, normal axis.  No evidence of ischemia otherwise normal EKG.  Compared to 06/29/2022, no significant change.   Medications and allergies   Allergies  Allergen Reactions   Sulfa Antibiotics Rash and  Other (See Comments)    Wheezing   Beta Adrenergic Blockers Other (See Comments)   Glipizide     Other reaction(s): low BS/jittery   Maxzide [Triamterene-Hctz]     Other reaction(s): Unknown   Metformin Hcl Other (See Comments)   Metoprolol Tartrate Other (See Comments)   Zocor [Simvastatin]     Other reaction(s): itching    Medication list   Current Outpatient Medications:    amLODipine (NORVASC) 10 MG tablet, TAKE 1 TABLET BY MOUTH DAILY, Disp: 90 tablet, Rfl: 2   buPROPion (WELLBUTRIN SR) 150 MG 12 hr tablet, Take 1 tablet (150 mg total) by mouth 2 (two) times daily., Disp: 180 tablet, Rfl: 3   carvedilol (COREG) 25 MG tablet, TAKE 1 TABLET(25 MG) BY MOUTH TWICE DAILY WITH A MEAL, Disp: 180 tablet, Rfl: 3   chlorthalidone (HYGROTON) 25 MG tablet, TAKE 1 TABLET BY MOUTH EVERY MORNING, Disp: 90 tablet, Rfl: 1   cloNIDine (CATAPRES) 0.1 MG tablet, Take 1 tablet (0.1 mg total) by mouth 2 (two) times daily., Disp:  60 tablet, Rfl: 11   clopidogrel (PLAVIX) 75 MG tablet, TAKE 1 TABLET BY MOUTH DAILY, Disp: 90 tablet, Rfl: 1   ergocalciferol (VITAMIN D2) 50000 UNITS capsule, Take 50,000 Units by mouth. 1 every 2 weeks, Disp: , Rfl:    Evolocumab (REPATHA SURECLICK) 140 MG/ML SOAJ, Inject 1 mL into the skin every 14 (fourteen) days., Disp: 2 mL, Rfl: 3   hydrALAZINE (APRESOLINE) 50 MG tablet, Take 2 tablets (100 mg total) by mouth 3 (three) times daily. (Julie [name] taking differently: Take 50 mg by mouth in the morning and at bedtime.), Disp: 180 tablet, Rfl: 2   insulin aspart (NOVOLOG) 100 UNIT/ML injection, See admin instructions., Disp: , Rfl:    levothyroxine (SYNTHROID) 125 MCG tablet, Take 125 mcg by mouth daily., Disp: , Rfl:    Potassium Chloride ER 20 MEQ TBCR, Take 1 tablet by mouth 2 (two) times daily., Disp: , Rfl:    rosuvastatin (CRESTOR) 10 MG tablet, 1 tablet Orally Once a day for 30 day(s), Disp: , Rfl:    tirzepatide (MOUNJARO) 5 MG/0.5ML Pen, Inject 5 mg into the skin once a week., Disp: , Rfl:    ZIOPTAN 0.0015 % SOLN, , Disp: , Rfl:   Assessment     ICD-10-CM   1. Essential hypertension  I10     2. Asymptomatic carotid artery stenosis, bilateral  I65.23 EKG 12-Lead    3. Hypercholesteremia  E78.00     4. White coat syndrome with diagnosis of hypertension  I10        Orders Placed This Encounter  Procedures   EKG 12-Lead    No orders of the defined types were placed in this encounter.   Medications Discontinued During This Encounter  Medication Reason   aspirin EC 81 MG tablet Completed Course     Recommendations:   Julie [name] [name] a 67 y.o. AAF Julie [name] with severe anxiety, long-standing history of difficult to control hypertension, orthostatic hypotension due to diabetic autonomic insufficiency, bilateral renal artery stenting for high-grade bilateral renal artery stenosis in September 2016, stage III-IV chronic kidney disease secondary to uncontrolled diabetes mellitus,  high-grade asymptomatic left carotid artery stenosis and moderate stenosis of the right carotid artery.  1. Asymptomatic carotid artery stenosis, bilateral Julie [name] has done remarkably well, carotid stenosis severity has clearly improved since being on aggressive medical therapy with control of hypercholesterolemia and diabetes mellitus.  Will continue 6 monthly surveillance.  Julie [name] due  for carotid artery duplex.  No change in physical exam.  Julie [name] [name] presently on DAPT, will discontinue aspirin and continue Plavix alone.  Hopefully this should help with Julie [name] GERD as well. - EKG 12-Lead  2. Hypercholesteremia Lipids in excellent control.  I reviewed Julie [name] external labs.  3. Essential hypertension Blood pressure [name] elevated today, Julie [name] was seen RPM in our office and blood pressure has always been well-controlled, Julie [name] brings home recordings where the blood pressure has been no greater than 130 mmHg systolic.  Hence I did not make any changes to Julie [name] present medical regimen.  4. White coat syndrome with diagnosis of hypertension Julie [name] has severe anxiety syndrome, Julie [name] does have whitecoat hypertension, I did address and evaluated Julie [name] home recordings.  No changes in the medications were done today.  Overall Julie [name] remains stable, no significant change in Julie [name] EKG, I encouraged Julie [name] to continue to lose weight if possible.  I will see Julie [name] back in 6 months to 8 months depending upon Julie [name] next carotid artery duplex.    Yates Decamp, MD, [facility] 07/03/2023, 9:58 AM Office: [phone]

## 2023-07-16 DIAGNOSIS — E113293 Type 2 diabetes mellitus with mild nonproliferative diabetic retinopathy without macular edema, bilateral: Secondary | ICD-10-CM | POA: Diagnosis not present

## 2023-07-16 DIAGNOSIS — H2513 Age-related nuclear cataract, bilateral: Secondary | ICD-10-CM | POA: Diagnosis not present

## 2023-07-16 DIAGNOSIS — H43813 Vitreous degeneration, bilateral: Secondary | ICD-10-CM | POA: Diagnosis not present

## 2023-07-16 DIAGNOSIS — H401132 Primary open-angle glaucoma, bilateral, moderate stage: Secondary | ICD-10-CM | POA: Diagnosis not present

## 2023-07-29 ENCOUNTER — Other Ambulatory Visit: Payer: Self-pay | Admitting: Cardiology

## 2023-07-29 DIAGNOSIS — I10 Essential (primary) hypertension: Secondary | ICD-10-CM

## 2023-07-31 ENCOUNTER — Other Ambulatory Visit: Payer: Medicare PPO

## 2023-08-02 ENCOUNTER — Encounter: Payer: Medicare PPO | Admitting: Gastroenterology

## 2023-08-20 ENCOUNTER — Other Ambulatory Visit: Payer: Self-pay | Admitting: Internal Medicine

## 2023-08-21 ENCOUNTER — Telehealth: Payer: Self-pay | Admitting: Physician Assistant

## 2023-08-21 ENCOUNTER — Telehealth: Payer: Self-pay

## 2023-08-21 NOTE — Telephone Encounter (Signed)
Inbound call from patient stating that she is scheduled to have a colonoscopy on 10/31 with Dr. Myrtie Neither and is requesting a call from the nurse to discuss when she needs to stop taking her medications. Please advise.

## 2023-08-21 NOTE — Telephone Encounter (Signed)
[facility] Pre-operative Risk Assessment    URGENT    Request for surgical clearance:     Endoscopy Procedure  What type of surgery is being performed?     colonoscopy  When is this surgery scheduled?     08/29/2023  What type of clearance is required ?   Pharmacy  Are there any medications that need to be held prior to surgery and how long? plavix  Practice name and name of physician performing surgery?      [facility]  What is your office phone and fax number?      Phone- [phone]  Fax- [phone]  Anesthesia type (None, local, MAC, general) ?       MAC

## 2023-08-21 NOTE — Telephone Encounter (Signed)
     Primary Cardiologist: Dr. Jacinto Halim  Chart reviewed as part of pre-operative protocol coverage. Given past medical history and time since last visit, based on ACC/AHA guidelines, Julie [name] would be at acceptable risk for the planned procedure without further cardiovascular testing.   If necessary her Plavix may be held for 5 days prior to her procedure.  Please resume as soon as hemostasis is achieved.  I will route this recommendation to the requesting party via Epic fax function and remove from pre-op pool.  Please call with questions.  Thomasene Ripple. Chantrell Apsey NP-C     08/21/2023, 12:55 PM [facility] [address] Office [phone] Fax [phone]

## 2023-08-25 ENCOUNTER — Encounter: Payer: Self-pay | Admitting: Certified Registered Nurse Anesthetist

## 2023-08-26 ENCOUNTER — Telehealth: Payer: Self-pay | Admitting: Cardiology

## 2023-08-26 MED ORDER — AMLODIPINE BESYLATE 10 MG PO TABS: 10.0000 mg | ORAL_TABLET | Freq: Every day | ORAL | 3 refills | Status: DC

## 2023-08-26 NOTE — Telephone Encounter (Signed)
 *  STAT* If patient is at the pharmacy, call can be transferred to refill team.   1. Which medications need to be refilled? (please list name of each medication and dose if known) amLODipine (NORVASC) 10 MG tablet    2. Would you like to learn more about the convenience, safety, & potential cost savings by using the [facility]?         3. Are you open to using the [facility] (Type [facility].    4. Which pharmacy/location (including street and city if local pharmacy) is medication to be sent to?  [id number] - [place], [place] - [address] AT [facility] OF [address]    5. Do they need a 30 day or 90 day supply? 90 days  Pt is out of meds and need refill today

## 2023-08-26 NOTE — Telephone Encounter (Signed)
Pt's medication was sent to pt's pharmacy as requested. Confirmation received.  °

## 2023-08-28 ENCOUNTER — Telehealth: Payer: Self-pay

## 2023-08-28 NOTE — Telephone Encounter (Signed)
Pt made aware of Dr. Myrtie Neither recommendations: Pt requested prep instructions be updated where as she had been recently rescheduled.  Pt verbalized understanding with all questions answered.

## 2023-08-28 NOTE — Telephone Encounter (Signed)
-----   Message from Charlie Pitter III sent at 08/27/2023  3:23 PM EDT ----- Regarding: Additional instruction for bowel preparation Julie [name],  I am looking over my charts for procedures this Thursday.  This patient was seen by the APP in August and will be having a surveillance colonoscopy.  The note indicates that she has chronic constipation with a BM every 3 to 4 days.  I do not know which bowel prep she was given, but if it was Suprep or Plenvu, then she needs to take 20 mg of Dulcolax about 2 hours prior to starting the evening dose. Please contact her with that info.  Thanks  HD

## 2023-08-29 ENCOUNTER — Ambulatory Visit: Payer: Medicare PPO | Admitting: Gastroenterology

## 2023-08-29 ENCOUNTER — Encounter: Payer: Self-pay | Admitting: Gastroenterology

## 2023-08-29 VITALS — BP 165/61 | HR 64 | Temp 98.0°F | Resp 13 | Ht 63.0 in | Wt 244.0 lb

## 2023-08-29 DIAGNOSIS — Z860101 Personal history of adenomatous and serrated colon polyps: Secondary | ICD-10-CM | POA: Diagnosis not present

## 2023-08-29 DIAGNOSIS — E669 Obesity, unspecified: Secondary | ICD-10-CM | POA: Diagnosis not present

## 2023-08-29 DIAGNOSIS — D123 Benign neoplasm of transverse colon: Secondary | ICD-10-CM

## 2023-08-29 DIAGNOSIS — E119 Type 2 diabetes mellitus without complications: Secondary | ICD-10-CM | POA: Diagnosis not present

## 2023-08-29 DIAGNOSIS — Z1211 Encounter for screening for malignant neoplasm of colon: Secondary | ICD-10-CM | POA: Diagnosis not present

## 2023-08-29 DIAGNOSIS — Z09 Encounter for follow-up examination after completed treatment for conditions other than malignant neoplasm: Secondary | ICD-10-CM

## 2023-08-29 DIAGNOSIS — E039 Hypothyroidism, unspecified: Secondary | ICD-10-CM | POA: Diagnosis not present

## 2023-08-29 DIAGNOSIS — I10 Essential (primary) hypertension: Secondary | ICD-10-CM | POA: Diagnosis not present

## 2023-08-29 MED ORDER — SODIUM CHLORIDE 0.9 % IV SOLN: 500.0000 mL | Freq: Once | INTRAVENOUS | Status: DC

## 2023-08-29 NOTE — Progress Notes (Signed)
Called to room to assist during endoscopic procedure.  Patient ID and intended procedure confirmed with present staff. Received instructions for my participation in the procedure from the performing physician.  

## 2023-08-29 NOTE — Progress Notes (Signed)
History and Physical:  This patient presents for endoscopic testing for: Encounter Diagnosis  Name Primary?   History of adenomatous polyp of colon Yes    67 year old woman here today for a surveillance colonoscopy. 2 subcentimeter tubular adenomas August 2018 She was evaluated in our office by APP on 06/26/2023, with clinical details of her medical history outlined in that note.  She has had no significant clinical changes since then.  This patient has also been off her Plavix for the last 5 days for the procedure.  (Indication being cerebrovascular and peripheral arterial disease)  Patient is otherwise without complaints or active issues today.   Past Medical History: Past Medical History:  Diagnosis Date   Abnormal thyroid scan    decreased thyroid   Actinomyces infection    h/o   Anovulation    chronic   Encounter for IUD insertion 09/15/2010 and 09/17/2005   Mirena   Encounter for IUD insertion 06/02/1990   Paraguard   H/O amenorrhea    Hx of elevated lipids    Hypertension    Hypothyroidism    Obesity    Oligomenorrhea    h/o   PCOS (polycystic ovarian syndrome)    Type 2 diabetes mellitus (HCC)      Past Surgical History: Past Surgical History:  Procedure Laterality Date   PERIPHERAL VASCULAR CATHETERIZATION N/A 07/05/2015   Procedure: Renal Angiography;  Surgeon: Yates Decamp, MD;  Location[facility] INVASIVE CV LAB;  Service: Cardiovascular;  Laterality: N/A;   PERIPHERAL VASCULAR CATHETERIZATION Bilateral 07/05/2015   Procedure: Renal Intervention;  Surgeon: Yates Decamp, MD;  Location: [facility] INVASIVE CV LAB;  Service: Cardiovascular;  Laterality: Bilateral;  right and left renal stent placement   PERIPHERAL VASCULAR CATHETERIZATION N/A 12/16/2015   Procedure: Renal Angiography;  Surgeon: Yates Decamp, MD;  Location: [facility] INVASIVE CV LAB;  Service: Cardiovascular;  Laterality: N/A;   PERIPHERAL VASCULAR CATHETERIZATION Left 12/16/2015   Procedure: Carotid Angiography/Bilateral;   Surgeon: Yates Decamp, MD;  Location: [facility] INVASIVE CV LAB;  Service: Cardiovascular;  Laterality: Left;   PERIPHERAL VASCULAR CATHETERIZATION  12/16/2015   Procedure: Peripheral Vascular Balloon Angioplasty;  Surgeon: Yates Decamp, MD;  Location: [facility] INVASIVE CV LAB;  Service: Cardiovascular;;  both sides   THYROID EXPLORATION     PT STATES HAD PROCEDURE DONE ON THYROID   WISDOM TOOTH EXTRACTION  1976    Allergies: Allergies  Allergen Reactions   Sulfa Antibiotics Rash and Other (See Comments)    Wheezing   Beta Adrenergic Blockers Other (See Comments)   Glipizide     Other reaction(s): low BS/jittery   Maxzide [Triamterene-Hctz]     Other reaction(s): Unknown   Metformin Hcl Other (See Comments)   Metoprolol Tartrate Other (See Comments)   Zocor [Simvastatin]     Other reaction(s): itching    Outpatient Meds: Current Outpatient Medications  Medication Sig Dispense Refill   amLODipine (NORVASC) 10 MG tablet Take 1 tablet (10 mg total) by mouth daily. 90 tablet 3   buPROPion (WELLBUTRIN SR) 150 MG 12 hr tablet Take 1 tablet (150 mg total) by mouth 2 (two) times daily. 180 tablet 3   carvedilol (COREG) 25 MG tablet TAKE 1 TABLET(25 MG) BY MOUTH TWICE DAILY WITH A MEAL 180 tablet 3   chlorthalidone (HYGROTON) 25 MG tablet TAKE 1 TABLET BY MOUTH EVERY MORNING 90 tablet 1   cloNIDine (CATAPRES) 0.1 MG tablet Take 1 tablet (0.1 mg total) by mouth 2 (two) times daily. 60 tablet 11   hydrALAZINE (APRESOLINE)  50 MG tablet Take 2 tablets (100 mg total) by mouth 3 (three) times daily. (Patient taking differently: Take 50 mg by mouth in the morning and at bedtime.) 180 tablet 2   levothyroxine (SYNTHROID) 125 MCG tablet Take 125 mcg by mouth daily.     Potassium Chloride ER 20 MEQ TBCR Take 1 tablet by mouth 2 (two) times daily.     rosuvastatin (CRESTOR) 10 MG tablet 1 tablet Orally Once a day for 30 day(s)     clopidogrel (PLAVIX) 75 MG tablet TAKE 1 TABLET BY MOUTH DAILY 90 tablet 1    ergocalciferol (VITAMIN D2) 50000 UNITS capsule Take 50,000 Units by mouth. 1 every 2 weeks     Evolocumab (REPATHA SURECLICK) 140 MG/ML SOAJ Inject 1 mL into the skin every 14 (fourteen) days. 2 mL 3   tirzepatide (MOUNJARO) 5 MG/0.5ML Pen Inject 5 mg into the skin once a week.     ZIOPTAN 0.0015 % SOLN  (Patient not taking: Reported on 08/29/2023)     Current Facility-Administered Medications  Medication Dose Route Frequency Provider Last Rate Last Admin   0.9 %  sodium chloride infusion  500 mL Intravenous Once Danis, Andreas Blower, MD          ___________________________________________________________________ Objective   Exam:  BP (!) 157/62   Pulse 70   Temp 98 F (36.7 C)   Ht 5\' 3"  (1.6 m)   Wt 244 lb (110.7 kg)   SpO2 100%   BMI 43.22 kg/m   CV: regular , S1/S2 Resp: clear to auscultation bilaterally, normal RR and effort noted GI: soft, no tenderness, with active bowel sounds.   Assessment: Encounter Diagnosis  Name Primary?   History of adenomatous polyp of colon Yes     Plan: Colonoscopy   The benefits and risks of the planned procedure were described in detail with the patient or (when appropriate) their health care proxy.  Risks were outlined as including, but not limited to, bleeding, infection, perforation, adverse medication reaction leading to cardiac or pulmonary decompensation, pancreatitis (if ERCP).  The limitation of incomplete mucosal visualization was also discussed.  No guarantees or warranties were given.  The patient is appropriate for an endoscopic procedure in the ambulatory setting.   - Amada Jupiter, MD

## 2023-08-29 NOTE — Progress Notes (Signed)
Report given to PACU, vss 

## 2023-08-29 NOTE — Op Note (Signed)
[facility] Patient Name: Julie [name] Procedure Date: 08/29/2023 9:39 AM MRN: [id number] Endoscopist: Sherilyn Cooter L. Myrtie Neither , MD, [id number] Age: 67 Referring MD:  Date of Birth: [birth year] Gender: Female Account #: [id number] Procedure:                Colonoscopy Indications:              Surveillance: Personal history of adenomatous                            polyps on last colonoscopy > 5 years ago                           Two SubCM tubular adenomas August 2018 Medicines:                Monitored Anesthesia Care Procedure:                Pre-Anesthesia Assessment:                           - Prior to the procedure, a History and Physical                            was performed, and patient medications and                            allergies were reviewed. The patient's tolerance of                            previous anesthesia was also reviewed. The risks                            and benefits of the procedure and the sedation                            options and risks were discussed with the patient.                            All questions were answered, and informed consent                            was obtained. Prior Anticoagulants: The patient has                            taken Plavix (clopidogrel), last dose was 5 days                            prior to procedure. ASA Grade Assessment: III - A                            patient with severe systemic disease. After                            reviewing the risks and benefits, the patient was  deemed in satisfactory condition to undergo the                            procedure.                           After obtaining informed consent, the colonoscope                            was passed under direct vision. Throughout the                            procedure, the patient's blood pressure, pulse, and                            oxygen saturations were monitored continuously. The                             CF [id number] #[id number] was introduced through the anus                            and advanced to the the cecum, identified by                            appendiceal orifice and ileocecal valve. The                            colonoscopy was performed without difficulty. The                            patient tolerated the procedure well. The quality                            of the bowel preparation was excellent. The                            ileocecal valve, appendiceal orifice, and rectum                            were photographed. Scope In: 9:43:16 AM Scope Out: 9:56:32 AM Scope Withdrawal Time: 0 hours 10 minutes 50 seconds  Total Procedure Duration: 0 hours 13 minutes 16 seconds  Findings:                 The perianal and digital rectal examinations were                            normal.                           Repeat examination of right colon under NBI                            performed.  A diminutive polyp was found in the transverse                            colon. The polyp was sessile. The polyp was removed                            with a cold snare. Resection and retrieval were                            complete.                           The exam was otherwise without abnormality on                            direct and retroflexion views. Complications:            No immediate complications. Estimated Blood Loss:     Estimated blood loss was minimal. Impression:               - One diminutive polyp in the transverse colon,                            removed with a cold snare. Resected and retrieved.                           - The examination was otherwise normal on direct                            and retroflexion views. Recommendation:           - Patient has a contact number available for                            emergencies. The signs and symptoms of potential                            delayed complications  were discussed with the                            patient. Return to normal activities tomorrow.                            Written discharge instructions were provided to the                            patient.                           - Resume previous diet.                           - Continue present medications.                           - Resume Plavix (clopidogrel) at prior dose today.                           -  Repeat colonoscopy is recommended for                            surveillance. The colonoscopy date will be                            determined after pathology results from today's                            exam become available for review. Cainen Burnham L. Myrtie Neither, MD 08/29/2023 10:00:14 AM This report has been signed electronically.

## 2023-08-29 NOTE — Patient Instructions (Signed)
-  Restart Plavix (clopidogrel) at prior dose today -Handout on polyps provided -await pathology results -repeat colonoscopy for surveillance recommended. Date to be determined when pathology result become available   -Continue present medications   YOU HAD AN ENDOSCOPIC PROCEDURE TODAY AT THE [facility]:   Refer to the procedure report that was given to you for any specific questions about what was found during the examination.  If the procedure report does not answer your questions, please call your gastroenterologist to clarify.  If you requested that your care partner not be given the details of your procedure findings, then the procedure report has been included in a sealed envelope for you to review at your convenience later.  YOU SHOULD EXPECT: Some feelings of bloating in the abdomen. Passage of more gas than usual.  Walking can help get rid of the air that was put into your GI tract during the procedure and reduce the bloating. If you had a lower endoscopy (such as a colonoscopy or flexible sigmoidoscopy) you may notice spotting of blood in your stool or on the toilet paper. If you underwent a bowel prep for your procedure, you may not have a normal bowel movement for a few days.  Please Note:  You might notice some irritation and congestion in your nose or some drainage.  This is from the oxygen used during your procedure.  There is no need for concern and it should clear up in a day or so.  SYMPTOMS TO REPORT IMMEDIATELY:  Following lower endoscopy (colonoscopy or flexible sigmoidoscopy):  Excessive amounts of blood in the stool  Significant tenderness or worsening of abdominal pains  Swelling of the abdomen that is new, acute  Fever of 100F or higher   For urgent or emergent issues, a gastroenterologist can be reached at any hour by calling [phone]. Do not use MyChart messaging for urgent concerns.    DIET:  We do recommend a small meal at first, but then you  may proceed to your regular diet.  Drink plenty of fluids but you should avoid alcoholic beverages for 24 hours.  ACTIVITY:  You should plan to take it easy for the rest of today and you should NOT DRIVE or use heavy machinery until tomorrow (because of the sedation medicines used during the test).    FOLLOW UP: Our staff will call the number listed on your records the next business day following your procedure.  We will call around 7:15- 8:00 am to check on you and address any questions or concerns that you may have regarding the information given to you following your procedure. If we do not reach you, we will leave a message.     If any biopsies were taken you will be contacted by phone or by letter within the next 1-3 weeks.  Please call us at [phone] if you have not heard about the biopsies in 3 weeks.    SIGNATURES/CONFIDENTIALITY: You and/or your care partner have signed paperwork which will be entered into your electronic medical record.  These signatures attest to the fact that that the information above on your After Visit Summary has been reviewed and is understood.  Full responsibility of the confidentiality of this discharge information lies with you and/or your care-partner.

## 2023-08-30 ENCOUNTER — Telehealth: Payer: Self-pay | Admitting: *Deleted

## 2023-08-30 NOTE — Telephone Encounter (Signed)
No answer on  follow up call. Left message.   

## 2023-09-02 ENCOUNTER — Encounter: Payer: Self-pay | Admitting: Gastroenterology

## 2023-09-02 LAB — SURGICAL PATHOLOGY

## 2023-09-16 ENCOUNTER — Other Ambulatory Visit: Payer: Self-pay | Admitting: Internal Medicine

## 2023-09-19 ENCOUNTER — Other Ambulatory Visit: Payer: Self-pay

## 2023-09-19 MED ORDER — CLOPIDOGREL BISULFATE 75 MG PO TABS: 75.0000 mg | ORAL_TABLET | Freq: Every day | ORAL | 2 refills | Status: DC

## 2023-09-20 DIAGNOSIS — H6123 Impacted cerumen, bilateral: Secondary | ICD-10-CM | POA: Diagnosis not present

## 2023-09-20 DIAGNOSIS — Z6841 Body Mass Index (BMI) 40.0 and over, adult: Secondary | ICD-10-CM | POA: Diagnosis not present

## 2023-11-12 ENCOUNTER — Ambulatory Visit
Admission: RE | Admit: 2023-11-12 | Discharge: 2023-11-12 | Disposition: A | Discharge status: Home / Self Care | Payer: Medicare PPO | Source: Ambulatory Visit | Attending: Obstetrics and Gynecology | Admitting: Obstetrics and Gynecology

## 2023-11-12 DIAGNOSIS — Z13820 Encounter for screening for osteoporosis: Secondary | ICD-10-CM

## 2023-11-12 DIAGNOSIS — E2839 Other primary ovarian failure: Secondary | ICD-10-CM | POA: Diagnosis not present

## 2023-11-12 DIAGNOSIS — M8588 Other specified disorders of bone density and structure, other site: Secondary | ICD-10-CM | POA: Diagnosis not present

## 2023-11-12 DIAGNOSIS — N958 Other specified menopausal and perimenopausal disorders: Secondary | ICD-10-CM | POA: Diagnosis not present

## 2023-11-14 DIAGNOSIS — H2513 Age-related nuclear cataract, bilateral: Secondary | ICD-10-CM | POA: Diagnosis not present

## 2023-11-14 DIAGNOSIS — E113293 Type 2 diabetes mellitus with mild nonproliferative diabetic retinopathy without macular edema, bilateral: Secondary | ICD-10-CM | POA: Diagnosis not present

## 2023-11-14 DIAGNOSIS — H43813 Vitreous degeneration, bilateral: Secondary | ICD-10-CM | POA: Diagnosis not present

## 2023-11-14 DIAGNOSIS — H401132 Primary open-angle glaucoma, bilateral, moderate stage: Secondary | ICD-10-CM | POA: Diagnosis not present

## 2023-11-20 DIAGNOSIS — N1832 Chronic kidney disease, stage 3b: Secondary | ICD-10-CM | POA: Diagnosis not present

## 2023-11-28 DIAGNOSIS — E876 Hypokalemia: Secondary | ICD-10-CM | POA: Diagnosis not present

## 2023-11-28 DIAGNOSIS — N1832 Chronic kidney disease, stage 3b: Secondary | ICD-10-CM | POA: Diagnosis not present

## 2023-11-28 DIAGNOSIS — I10 Essential (primary) hypertension: Secondary | ICD-10-CM | POA: Diagnosis not present

## 2023-12-20 DIAGNOSIS — E039 Hypothyroidism, unspecified: Secondary | ICD-10-CM | POA: Diagnosis not present

## 2023-12-20 DIAGNOSIS — E7800 Pure hypercholesterolemia, unspecified: Secondary | ICD-10-CM | POA: Diagnosis not present

## 2023-12-20 DIAGNOSIS — E1165 Type 2 diabetes mellitus with hyperglycemia: Secondary | ICD-10-CM | POA: Diagnosis not present

## 2023-12-20 DIAGNOSIS — E559 Vitamin D deficiency, unspecified: Secondary | ICD-10-CM | POA: Diagnosis not present

## 2023-12-25 DIAGNOSIS — E7800 Pure hypercholesterolemia, unspecified: Secondary | ICD-10-CM | POA: Diagnosis not present

## 2023-12-25 DIAGNOSIS — E039 Hypothyroidism, unspecified: Secondary | ICD-10-CM | POA: Diagnosis not present

## 2023-12-25 DIAGNOSIS — I10 Essential (primary) hypertension: Secondary | ICD-10-CM | POA: Diagnosis not present

## 2023-12-25 DIAGNOSIS — E559 Vitamin D deficiency, unspecified: Secondary | ICD-10-CM | POA: Diagnosis not present

## 2023-12-25 DIAGNOSIS — E1165 Type 2 diabetes mellitus with hyperglycemia: Secondary | ICD-10-CM | POA: Diagnosis not present

## 2023-12-25 DIAGNOSIS — N189 Chronic kidney disease, unspecified: Secondary | ICD-10-CM | POA: Diagnosis not present

## 2023-12-26 ENCOUNTER — Ambulatory Visit (HOSPITAL_COMMUNITY)
Admission: RE | Admit: 2023-12-26 | Discharge: 2023-12-26 | Disposition: A | Discharge status: Home / Self Care | Payer: Medicare PPO | Source: Ambulatory Visit | Attending: Cardiology | Admitting: Cardiology

## 2023-12-26 DIAGNOSIS — I6523 Occlusion and stenosis of bilateral carotid arteries: Secondary | ICD-10-CM | POA: Insufficient documentation

## 2023-12-28 DIAGNOSIS — E119 Type 2 diabetes mellitus without complications: Secondary | ICD-10-CM | POA: Diagnosis not present

## 2024-01-01 DIAGNOSIS — N189 Chronic kidney disease, unspecified: Secondary | ICD-10-CM | POA: Diagnosis not present

## 2024-01-01 DIAGNOSIS — E7800 Pure hypercholesterolemia, unspecified: Secondary | ICD-10-CM | POA: Diagnosis not present

## 2024-01-01 DIAGNOSIS — E1165 Type 2 diabetes mellitus with hyperglycemia: Secondary | ICD-10-CM | POA: Diagnosis not present

## 2024-01-10 ENCOUNTER — Telehealth: Payer: Self-pay | Admitting: Cardiology

## 2024-01-10 DIAGNOSIS — I701 Atherosclerosis of renal artery: Secondary | ICD-10-CM

## 2024-01-10 MED ORDER — BUPROPION HCL ER (SR) 150 MG PO TB12: 150.0000 mg | ORAL_TABLET | Freq: Two times a day (BID) | ORAL | 0 refills | Status: DC

## 2024-01-10 NOTE — Telephone Encounter (Signed)
*  STAT* If patient is at the pharmacy, call can be transferred to refill team.   1. Which medications need to be refilled? (please list name of each medication and dose if known)  buPROPion (WELLBUTRIN SR) 150 MG 12 hr tablet  2. Which pharmacy/location (including street and city if local pharmacy) is medication to be sent to? [id number] [place], [place] - [phone] AT [facility] [address] [facility] [address] Phone: [phone]  Fax: [phone]     3. Do they need a 30 day or 90 day supply? 90

## 2024-01-10 NOTE — Telephone Encounter (Signed)
Forward to PCP please

## 2024-01-10 NOTE — Telephone Encounter (Signed)
 Pt does not see PCP until next month, she will not have enough medication to get her through to that appt.   She is asking if Dr. Jacinto Halim would be willing to fill a 30 day supply to get her through (Dr. Jacinto Halim is the only MD that has ever filled this).  Aware we will let her know once he responds.  She appreciates the help with this.

## 2024-01-10 NOTE — Telephone Encounter (Signed)
 Pt made aware and agreeable to plan.  She thanks Dr. Jacinto Halim for helping with this.

## 2024-01-10 NOTE — Telephone Encounter (Signed)
 Pt is requesting a refill on medication bupropion 150 mg tablet. Would Dr. Jacinto Halim like to refill this non cardiac medication? Please address

## 2024-01-10 NOTE — Telephone Encounter (Signed)
 90 days with 0 refills is okay with me

## 2024-01-30 ENCOUNTER — Ambulatory Visit: Payer: Medicare PPO | Admitting: Cardiology

## 2024-02-03 DIAGNOSIS — E782 Mixed hyperlipidemia: Secondary | ICD-10-CM | POA: Diagnosis not present

## 2024-02-03 DIAGNOSIS — Z0000 Encounter for general adult medical examination without abnormal findings: Secondary | ICD-10-CM | POA: Diagnosis not present

## 2024-02-03 DIAGNOSIS — I701 Atherosclerosis of renal artery: Secondary | ICD-10-CM | POA: Diagnosis not present

## 2024-02-03 DIAGNOSIS — I779 Disorder of arteries and arterioles, unspecified: Secondary | ICD-10-CM | POA: Diagnosis not present

## 2024-02-03 DIAGNOSIS — Z1331 Encounter for screening for depression: Secondary | ICD-10-CM | POA: Diagnosis not present

## 2024-02-03 DIAGNOSIS — E1122 Type 2 diabetes mellitus with diabetic chronic kidney disease: Secondary | ICD-10-CM | POA: Diagnosis not present

## 2024-02-03 DIAGNOSIS — E039 Hypothyroidism, unspecified: Secondary | ICD-10-CM | POA: Diagnosis not present

## 2024-02-03 DIAGNOSIS — I10 Essential (primary) hypertension: Secondary | ICD-10-CM | POA: Diagnosis not present

## 2024-02-03 DIAGNOSIS — N184 Chronic kidney disease, stage 4 (severe): Secondary | ICD-10-CM | POA: Diagnosis not present

## 2024-03-12 ENCOUNTER — Encounter: Payer: Self-pay | Admitting: Cardiology

## 2024-03-12 ENCOUNTER — Ambulatory Visit: Attending: Cardiology | Admitting: Cardiology

## 2024-03-12 ENCOUNTER — Other Ambulatory Visit: Payer: Self-pay | Admitting: Cardiology

## 2024-03-12 VITALS — BP 159/65 | HR 66 | Resp 16 | Ht 63.0 in | Wt 242.0 lb

## 2024-03-12 DIAGNOSIS — E7800 Pure hypercholesterolemia, unspecified: Secondary | ICD-10-CM | POA: Diagnosis not present

## 2024-03-12 DIAGNOSIS — I951 Orthostatic hypotension: Secondary | ICD-10-CM

## 2024-03-12 DIAGNOSIS — I1A0 Resistant hypertension: Secondary | ICD-10-CM | POA: Diagnosis not present

## 2024-03-12 DIAGNOSIS — I6523 Occlusion and stenosis of bilateral carotid arteries: Secondary | ICD-10-CM | POA: Diagnosis not present

## 2024-03-12 MED ORDER — HYDRALAZINE HCL 50 MG PO TABS
50.0000 mg | ORAL_TABLET | Freq: Three times a day (TID) | ORAL | 3 refills | Status: AC
Start: 2024-03-12 — End: 2024-06-10

## 2024-03-12 NOTE — Progress Notes (Signed)
 Cardiology Office Note:  .   Date:  03/12/2024  ID:  Julie [name], DOB [birth year], MRN [id number] PCP: Faustina Hood, MD  [facility] Providers Cardiologist:  Knox Perl, MD   History of Present Illness: .   Julie [name] is a 68 y.o. AAF patient with severe anxiety, long-standing history of difficult to control hypertension, orthostatic hypotension due to diabetic autonomic insufficiency, bilateral renal artery stenting for high-grade bilateral renal artery stenosis in September 2016, stage III-IV chronic kidney disease secondary to uncontrolled diabetes mellitus, high-grade asymptomatic left carotid artery stenosis and moderate stenosis of the right carotid artery.   Discussed the use of AI scribe software for clinical note transcription with the patient, who gave verbal consent to proceed.  History of Present Illness NOTIE WILDT is a 68 year old female with carotid artery disease, hypertension and diabetes who presents with dizziness and medication management. She experiences occasional dizziness upon standing without syncope. Her home blood pressure reading is 133 mmHg systolic. She is on multiple antihypertensive medications: hydralazine  50 mg three times daily, carvedilol  25 mg twice daily, amlodipine  10 mg once daily, and clonidine  0.1 mg twice daily. She sometimes forgets to take hydralazine  as prescribed. Potassium supplementation is taken twice daily.  Diabetes is well-controlled with Mounjaro, with a recent A1c of 6.2%. She is on Crestor  10 mg and Repatha  injections for cholesterol management and takes clopidogrel  for carotid artery disease.  She lives with her daughter and tends to stay home.  Labs   External Labs:  Care Everywhere labs 12/20/2023:  Serum glucose 85 mg, BUN 28, creatinine 1.81, EGFR 30 mL, sodium 140, potassium 3.8, ALT, AST normal, alkaline phosphatase elevated at 152.  Total cholesterol 132, triglycerides 89, HDL 53, LDL 62.  A1c 6.2%.  TSH  normal at 2.03.  Vitamin D  normal at 65.4.  ROS  Review of Systems  Cardiovascular:  Negative for chest pain, dyspnea on exertion and leg swelling.  Neurological:  Positive for light-headedness (occasional).    Physical Exam:   VS:  BP (!) 159/65 (BP Location: Left Arm, Patient Position: Sitting, Cuff Size: Large)   Pulse 66   Resp 16   Ht 5\' 3"  (1.6 m)   Wt 242 lb (109.8 kg)   SpO2 99%   BMI 42.87 kg/m    Wt Readings from Last 3 Encounters:  03/12/24 242 lb (109.8 kg)  08/29/23 244 lb (110.7 kg)  07/03/23 246 lb 9.6 oz (111.9 kg)    Physical Exam Constitutional:      Appearance: She is obese.  Neck:     Vascular: Carotid bruit (prominent bilateral carotid bruit) present. No JVD.  Cardiovascular:     Rate and Rhythm: Normal rate and regular rhythm.     Pulses: Intact distal pulses.     Heart sounds: Normal heart sounds. No murmur heard.    No gallop.  Pulmonary:     Effort: Pulmonary effort is normal.     Breath sounds: Normal breath sounds.  Abdominal:     General: Bowel sounds are normal.     Palpations: Abdomen is soft.  Musculoskeletal:     Right lower leg: No edema.     Left lower leg: No edema.     Carotid artery duplex 12/26/2023: Right ICA consistent with 60 to 79% stenosis and <50% stenosis in the common carotid artery with severe calcific and mixed plaque. Left ICA stenosis consistent with 60 to 79% stenosis and >50% stenosis in the left CCA  probably >99% stenosis. Antegrade bilateral vertebral artery flow and normal bilateral subclavian hemodynamics. Suggest follow up study in 6 months. Compared to 12/24/22, there is mild  progression of the disease severity espicially left CCA.. High surgical  risk due to diffuse plaque throughout the CCA and ICA.  Suggest Peripheral Vascular Consult due to left distal CCA >50% stenosis.   EKG:         Medications and allergies    Allergies  Allergen Reactions   Sulfa Antibiotics Rash and Other (See Comments)     Wheezing   Beta Adrenergic Blockers Other (See Comments)   Glipizide     Other reaction(s): low BS/jittery   Maxzide [Triamterene-Hctz]     Other reaction(s): Unknown   Metformin Hcl Other (See Comments)   Metoprolol Tartrate Other (See Comments)   Zocor  [Simvastatin ]     Other reaction(s): itching     Current Outpatient Medications:    amLODipine  (NORVASC ) 10 MG tablet, Take 1 tablet (10 mg total) by mouth daily., Disp: 90 tablet, Rfl: 3   buPROPion  (WELLBUTRIN  SR) 150 MG 12 hr tablet, Take 1 tablet (150 mg total) by mouth 2 (two) times daily., Disp: 180 tablet, Rfl: 0   carvedilol  (COREG ) 25 MG tablet, TAKE 1 TABLET(25 MG) BY MOUTH TWICE DAILY WITH A MEAL, Disp: 180 tablet, Rfl: 3   cloNIDine  (CATAPRES ) 0.1 MG tablet, Take 1 tablet (0.1 mg total) by mouth 2 (two) times daily., Disp: 60 tablet, Rfl: 11   clopidogrel  (PLAVIX ) 75 MG tablet, Take 1 tablet (75 mg total) by mouth daily., Disp: 90 tablet, Rfl: 2   ergocalciferol  (VITAMIN D2) 50000 UNITS capsule, Take 50,000 Units by mouth once a week. 1 every 2 weeks, Disp: , Rfl:    Evolocumab  (REPATHA  SURECLICK) 140 MG/ML SOAJ, Inject 1 mL into the skin every 14 (fourteen) days., Disp: 2 mL, Rfl: 3   levothyroxine  (SYNTHROID ) 125 MCG tablet, Take 125 mcg by mouth daily., Disp: , Rfl:    Potassium Chloride  ER 20 MEQ TBCR, Take 1 tablet by mouth 2 (two) times daily., Disp: , Rfl:    rosuvastatin  (CRESTOR ) 10 MG tablet, 1 tablet Orally Once a day for 30 day(s), Disp: , Rfl:    tirzepatide (MOUNJARO) 5 MG/0.5ML Pen, Inject 5 mg into the skin once a week., Disp: , Rfl:    chlorthalidone (HYGROTON) 25 MG tablet, TAKE 1 TABLET BY MOUTH EVERY MORNING, Disp: 90 tablet, Rfl: 3   hydrALAZINE  (APRESOLINE ) 50 MG tablet, Take 1 tablet (50 mg total) by mouth 3 (three) times daily., Disp: 270 tablet, Rfl: 3   Meds ordered this encounter  Medications   hydrALAZINE  (APRESOLINE ) 50 MG tablet    Sig: Take 1 tablet (50 mg total) by mouth 3 (three) times  daily.    Dispense:  270 tablet    Refill:  3     Medications Discontinued During This Encounter  Medication Reason   ZIOPTAN 0.0015 % SOLN Patient Preference   hydrALAZINE  (APRESOLINE ) 50 MG tablet Reorder     ASSESSMENT AND PLAN: .      ICD-10-CM   1. Asymptomatic carotid artery stenosis, bilateral  I65.23 VAS US  CAROTID    2. Resistant hypertension  I1A.0 hydrALAZINE  (APRESOLINE ) 50 MG tablet    3. Orthostatic hypotension  I95.1     4. Hypercholesteremia  E78.00       Assessment and Plan Assessment & Plan Orthostatic hypotension   Orthostatic hypotension is likely due to antihypertensive medications and diabetes. She reports occasional  dizziness without syncope, and blood pressure decreases upon standing.  She was not orthostatic today.  Hypertension   Hypertension is managed with hydralazine , carvedilol , amlodipine , clonidine , and potassium supplementation. Hydralazine  dosage is adjusted to 50 mg three times daily. Home blood pressure is 133 mmHg systolic. Occasional dizziness may be related to the medication regimen. Continue carvedilol  25 mg twice daily, amlodipine  10 mg once daily, clonidine  0.1 mg twice daily, and potassium supplementation twice daily.  Hopefully with increased dose of hydralazine , blood pressure will improve.  Bilateral carotid artery stenosis   Bilateral carotid artery stenosis is asymptomatic. She is on Crestor  and Repatha  for cholesterol management, contributing to stability. Schedule a carotid duplex ultrasound in six months.  In view of extreme high risk for carotid intervention in view of diffuse plaque all the way from common carotid artery to ICA, although she has high-grade stenosis ongoing for many years, I have been watching her and managing her risk factors.  Her diabetes now is well-controlled since addition of GLP-1 agonist and lipids are also well-controlled with addition of Repatha  and Crestor .  Continue aspirin  81 mg daily.    Hyperlipidemia, well controlled   Hyperlipidemia is well controlled with Crestor  10 mg and Repatha  injections. Recent cholesterol panel shows good control.  Type 2 diabetes mellitus, well controlled   Type 2 diabetes mellitus is well controlled with Mounjaro. Recent A1c is 6.2, indicating good glycemic control.  Office visit in 6 months.  Will repeat carotid artery duplex in 6 months.  Overall she is still at extreme high risk for cardiovascular complications.   Signed,  Knox Perl, MD, [facility] 03/12/2024, 5:46 PM [facility] [address] [place], [place] [address] Phone: [phone]. Fax:  [phone]

## 2024-03-12 NOTE — Patient Instructions (Addendum)
 Medication Instructions:  Your physician has recommended you make the following change in your medication: Increase hydralazine  to 50 mg three time daily   *If you need a refill on your cardiac medications before your next appointment, please call your pharmacy*  Lab Work: none If you have labs (blood work) drawn today and your tests are completely normal, you will receive your results only by: MyChart Message (if you have MyChart) OR A paper copy in the mail If you have any lab test that is abnormal or we need to change your treatment, we will call you to review the results.  Testing/Procedures: Your physician has requested that you have a carotid duplex in 6 months. This test is an ultrasound of the carotid arteries in your neck. It looks at blood flow through these arteries that supply the brain with blood. Allow one hour for this exam. There are no restrictions or special instructions.   Follow-Up: At [facility], you and your health needs are our priority.  As part of our continuing mission to provide you with exceptional heart care, our providers are all part of one team.  This team includes your primary Cardiologist (physician) and Advanced Practice Providers or APPs (Physician Assistants and Nurse Practitioners) who all work together to provide you with the care you need, when you need it.  Your next appointment:   6 month(s)--after carotid doppler  Provider:   Knox Perl, MD    We recommend signing up for the patient portal called "MyChart".  Sign up information is provided on this After Visit Summary.  MyChart is used to connect with patients for Virtual Visits (Telemedicine).  Patients are able to view lab/test results, encounter notes, upcoming appointments, etc.  Non-urgent messages can be sent to your provider as well.   To learn more about what you can do with MyChart, go to [email or web address].   Other Instructions  Please bring all the medications you are  taking to your next office visit

## 2024-04-23 DIAGNOSIS — Z01419 Encounter for gynecological examination (general) (routine) without abnormal findings: Secondary | ICD-10-CM | POA: Diagnosis not present

## 2024-04-23 DIAGNOSIS — Z1231 Encounter for screening mammogram for malignant neoplasm of breast: Secondary | ICD-10-CM | POA: Diagnosis not present

## 2024-05-04 ENCOUNTER — Other Ambulatory Visit: Payer: Self-pay | Admitting: Cardiology

## 2024-05-04 DIAGNOSIS — I701 Atherosclerosis of renal artery: Secondary | ICD-10-CM

## 2024-05-27 DIAGNOSIS — E119 Type 2 diabetes mellitus without complications: Secondary | ICD-10-CM | POA: Diagnosis not present

## 2024-05-27 DIAGNOSIS — H43813 Vitreous degeneration, bilateral: Secondary | ICD-10-CM | POA: Diagnosis not present

## 2024-05-27 DIAGNOSIS — H401132 Primary open-angle glaucoma, bilateral, moderate stage: Secondary | ICD-10-CM | POA: Diagnosis not present

## 2024-05-27 DIAGNOSIS — H2513 Age-related nuclear cataract, bilateral: Secondary | ICD-10-CM | POA: Diagnosis not present

## 2024-06-20 ENCOUNTER — Other Ambulatory Visit: Payer: Self-pay | Admitting: Cardiology

## 2024-06-24 DIAGNOSIS — E7800 Pure hypercholesterolemia, unspecified: Secondary | ICD-10-CM | POA: Diagnosis not present

## 2024-06-24 DIAGNOSIS — E039 Hypothyroidism, unspecified: Secondary | ICD-10-CM | POA: Diagnosis not present

## 2024-06-24 DIAGNOSIS — E1165 Type 2 diabetes mellitus with hyperglycemia: Secondary | ICD-10-CM | POA: Diagnosis not present

## 2024-06-24 DIAGNOSIS — E559 Vitamin D deficiency, unspecified: Secondary | ICD-10-CM | POA: Diagnosis not present

## 2024-07-01 DIAGNOSIS — F419 Anxiety disorder, unspecified: Secondary | ICD-10-CM | POA: Diagnosis not present

## 2024-07-01 DIAGNOSIS — B349 Viral infection, unspecified: Secondary | ICD-10-CM | POA: Diagnosis not present

## 2024-07-01 DIAGNOSIS — R051 Acute cough: Secondary | ICD-10-CM | POA: Diagnosis not present

## 2024-07-01 DIAGNOSIS — Z03818 Encounter for observation for suspected exposure to other biological agents ruled out: Secondary | ICD-10-CM | POA: Diagnosis not present

## 2024-07-01 DIAGNOSIS — N189 Chronic kidney disease, unspecified: Secondary | ICD-10-CM | POA: Diagnosis not present

## 2024-07-01 DIAGNOSIS — I10 Essential (primary) hypertension: Secondary | ICD-10-CM | POA: Diagnosis not present

## 2024-07-01 DIAGNOSIS — E1165 Type 2 diabetes mellitus with hyperglycemia: Secondary | ICD-10-CM | POA: Diagnosis not present

## 2024-07-01 DIAGNOSIS — E039 Hypothyroidism, unspecified: Secondary | ICD-10-CM | POA: Diagnosis not present

## 2024-07-01 DIAGNOSIS — R748 Abnormal levels of other serum enzymes: Secondary | ICD-10-CM | POA: Diagnosis not present

## 2024-07-01 DIAGNOSIS — J029 Acute pharyngitis, unspecified: Secondary | ICD-10-CM | POA: Diagnosis not present

## 2024-07-01 DIAGNOSIS — E559 Vitamin D deficiency, unspecified: Secondary | ICD-10-CM | POA: Diagnosis not present

## 2024-07-01 DIAGNOSIS — E7800 Pure hypercholesterolemia, unspecified: Secondary | ICD-10-CM | POA: Diagnosis not present

## 2024-07-02 DIAGNOSIS — E1165 Type 2 diabetes mellitus with hyperglycemia: Secondary | ICD-10-CM | POA: Diagnosis not present

## 2024-07-02 DIAGNOSIS — E039 Hypothyroidism, unspecified: Secondary | ICD-10-CM | POA: Diagnosis not present

## 2024-07-02 DIAGNOSIS — E7800 Pure hypercholesterolemia, unspecified: Secondary | ICD-10-CM | POA: Diagnosis not present

## 2024-07-06 DIAGNOSIS — N1832 Chronic kidney disease, stage 3b: Secondary | ICD-10-CM | POA: Diagnosis not present

## 2024-07-06 DIAGNOSIS — I129 Hypertensive chronic kidney disease with stage 1 through stage 4 chronic kidney disease, or unspecified chronic kidney disease: Secondary | ICD-10-CM | POA: Diagnosis not present

## 2024-07-06 DIAGNOSIS — E876 Hypokalemia: Secondary | ICD-10-CM | POA: Diagnosis not present

## 2024-08-05 DIAGNOSIS — Z23 Encounter for immunization: Secondary | ICD-10-CM | POA: Diagnosis not present

## 2024-08-05 DIAGNOSIS — H6123 Impacted cerumen, bilateral: Secondary | ICD-10-CM | POA: Diagnosis not present

## 2024-08-05 DIAGNOSIS — H9201 Otalgia, right ear: Secondary | ICD-10-CM | POA: Diagnosis not present

## 2024-08-05 DIAGNOSIS — T161XXA Foreign body in right ear, initial encounter: Secondary | ICD-10-CM | POA: Diagnosis not present

## 2024-08-24 ENCOUNTER — Other Ambulatory Visit: Payer: Self-pay

## 2024-08-25 ENCOUNTER — Other Ambulatory Visit: Payer: Self-pay | Admitting: Cardiology

## 2024-08-25 MED ORDER — AMLODIPINE BESYLATE 10 MG PO TABS: 10.0000 mg | ORAL_TABLET | Freq: Every day | ORAL | 2 refills | Status: AC

## 2024-08-30 ENCOUNTER — Other Ambulatory Visit: Payer: Self-pay | Admitting: Cardiology

## 2024-08-30 DIAGNOSIS — I10 Essential (primary) hypertension: Secondary | ICD-10-CM

## 2024-09-03 DIAGNOSIS — H401132 Primary open-angle glaucoma, bilateral, moderate stage: Secondary | ICD-10-CM | POA: Diagnosis not present

## 2024-09-03 DIAGNOSIS — H2513 Age-related nuclear cataract, bilateral: Secondary | ICD-10-CM | POA: Diagnosis not present

## 2024-09-03 DIAGNOSIS — E119 Type 2 diabetes mellitus without complications: Secondary | ICD-10-CM | POA: Diagnosis not present

## 2024-09-03 DIAGNOSIS — H43813 Vitreous degeneration, bilateral: Secondary | ICD-10-CM | POA: Diagnosis not present

## 2024-09-07 DIAGNOSIS — T161XXA Foreign body in right ear, initial encounter: Secondary | ICD-10-CM | POA: Diagnosis not present

## 2024-09-14 ENCOUNTER — Ambulatory Visit (HOSPITAL_COMMUNITY)
Admission: RE | Admit: 2024-09-14 | Discharge: 2024-09-14 | Disposition: A | Discharge status: Home / Self Care | Source: Ambulatory Visit | Attending: Cardiology | Admitting: Cardiology

## 2024-09-14 DIAGNOSIS — I6523 Occlusion and stenosis of bilateral carotid arteries: Secondary | ICD-10-CM | POA: Diagnosis not present

## 2024-09-15 ENCOUNTER — Ambulatory Visit: Payer: Self-pay | Admitting: Cardiology

## 2024-09-15 NOTE — Progress Notes (Signed)
 Carotid artery duplex 09/14/2024: Right ICA has 60 to 79% calcific stenosis, high bifurcation.  CCA has <50% diffuse homogenous plaque.  Quality of study confounded by patient movements and high bifurcation. Left proximal ICA 60 to 79% stenosis and mid 40 to 59% stenosis.  Diffuse plaque is evident in the CCA. Bilateral antegrade vertebral artery flow. Bilateral subclavian hemodynamics is normal. Compared to 12/26/2023, no significant change.  Recheck in 6 months.

## 2024-09-30 DIAGNOSIS — H43813 Vitreous degeneration, bilateral: Secondary | ICD-10-CM | POA: Diagnosis not present

## 2024-09-30 DIAGNOSIS — H2513 Age-related nuclear cataract, bilateral: Secondary | ICD-10-CM | POA: Diagnosis not present

## 2024-09-30 DIAGNOSIS — E119 Type 2 diabetes mellitus without complications: Secondary | ICD-10-CM | POA: Diagnosis not present

## 2024-09-30 DIAGNOSIS — H401132 Primary open-angle glaucoma, bilateral, moderate stage: Secondary | ICD-10-CM | POA: Diagnosis not present
